# Patient Record
Sex: Female | Born: 1985 | Race: White | Hispanic: No | Marital: Single | Smoking: Former smoker
Health system: Southern US, Community
[De-identification: ages and names within clinical notes are randomized; demographics above are authoritative.]

## PROBLEM LIST (undated history)

## (undated) DIAGNOSIS — F32A Depression, unspecified: Secondary | ICD-10-CM

## (undated) DIAGNOSIS — O139 Gestational [pregnancy-induced] hypertension without significant proteinuria, unspecified trimester: Secondary | ICD-10-CM

## (undated) DIAGNOSIS — F329 Major depressive disorder, single episode, unspecified: Secondary | ICD-10-CM

## (undated) HISTORY — PX: TUBAL LIGATION: SHX77

## (undated) HISTORY — PX: BREAST SURGERY: SHX581

---

## 2006-11-16 ENCOUNTER — Emergency Department: Payer: Self-pay | Admitting: Unknown Physician Specialty

## 2007-07-04 ENCOUNTER — Observation Stay: Payer: Self-pay | Admitting: Unknown Physician Specialty

## 2008-06-29 ENCOUNTER — Inpatient Hospital Stay: Payer: Self-pay | Admitting: Internal Medicine

## 2009-01-07 ENCOUNTER — Emergency Department: Payer: Self-pay | Admitting: Internal Medicine

## 2010-08-03 ENCOUNTER — Emergency Department: Payer: Self-pay | Admitting: Emergency Medicine

## 2011-04-12 ENCOUNTER — Emergency Department: Payer: Self-pay | Admitting: Emergency Medicine

## 2011-11-05 ENCOUNTER — Emergency Department: Payer: Self-pay | Admitting: *Deleted

## 2011-11-05 LAB — COMPREHENSIVE METABOLIC PANEL
Albumin: 3.9 g/dL (ref 3.4–5.0)
Alkaline Phosphatase: 83 U/L (ref 50–136)
Anion Gap: 6 — ABNORMAL LOW (ref 7–16)
Bilirubin,Total: 0.4 mg/dL (ref 0.2–1.0)
Chloride: 108 mmol/L — ABNORMAL HIGH (ref 98–107)
Co2: 27 mmol/L (ref 21–32)
Creatinine: 0.75 mg/dL (ref 0.60–1.30)
EGFR (Non-African Amer.): >60
Glucose: 83 mg/dL (ref 65–99)
SGOT(AST): 55 U/L — ABNORMAL HIGH (ref 15–37)
SGPT (ALT): 94 U/L — ABNORMAL HIGH
Sodium: 141 mmol/L (ref 136–145)
Total Protein: 7.2 g/dL (ref 6.4–8.2)

## 2011-11-05 LAB — URINALYSIS, COMPLETE
Bacteria: NONE SEEN
Bilirubin,UR: NEGATIVE
Leukocyte Esterase: NEGATIVE
Nitrite: NEGATIVE
RBC,UR: 5 /HPF (ref 0–5)
Squamous Epithelial: 1

## 2011-11-05 LAB — HCG, QUANTITATIVE, PREGNANCY: Beta Hcg, Quant.: <1 m[IU]/mL — ABNORMAL LOW

## 2011-11-05 LAB — CBC
MCHC: 32.5 g/dL (ref 32.0–36.0)
WBC: 12.5 10*3/uL — ABNORMAL HIGH (ref 3.6–11.0)

## 2013-04-24 ENCOUNTER — Observation Stay: Payer: Self-pay

## 2013-04-24 LAB — DRUG SCREEN, URINE
Amphetamines, Ur Screen: NEGATIVE (ref ?–1000)
Barbiturates, Ur Screen: POSITIVE (ref ?–200)
Benzodiazepine, Ur Scrn: NEGATIVE (ref ?–200)
Cannabinoid 50 Ng, Ur ~~LOC~~: NEGATIVE (ref ?–50)
MDMA (Ecstasy)Ur Screen: POSITIVE (ref ?–500)
Tricyclic, Ur Screen: NEGATIVE (ref ?–1000)

## 2013-04-24 LAB — CBC WITH DIFFERENTIAL/PLATELET
Basophil #: 0 10*3/uL (ref 0.0–0.1)
Basophil %: 0.3 %
Eosinophil %: 0.1 %
HGB: 11.1 g/dL — ABNORMAL LOW (ref 12.0–16.0)
Lymphocyte #: 0.7 10*3/uL — ABNORMAL LOW (ref 1.0–3.6)
Lymphocyte %: 5.9 %
MCH: 25.2 pg — ABNORMAL LOW (ref 26.0–34.0)
MCV: 75 fL — ABNORMAL LOW (ref 80–100)
Monocyte #: 0.5 x10 3/mm (ref 0.2–0.9)
Monocyte %: 4 %
Neutrophil %: 89.7 %
Platelet: 221 10*3/uL (ref 150–440)
RBC: 4.42 10*6/uL (ref 3.80–5.20)
RDW: 14.1 % (ref 11.5–14.5)
WBC: 11.7 10*3/uL — ABNORMAL HIGH (ref 3.6–11.0)

## 2013-04-24 LAB — URINALYSIS, COMPLETE
Bacteria: NONE SEEN
Blood: NEGATIVE
Glucose,UR: NEGATIVE mg/dL (ref 0–75)
Leukocyte Esterase: NEGATIVE
Ph: 6 (ref 4.5–8.0)
RBC,UR: NONE SEEN /HPF (ref 0–5)
Specific Gravity: 1.027 (ref 1.003–1.030)
Squamous Epithelial: 2

## 2013-04-24 LAB — BASIC METABOLIC PANEL
Anion Gap: 12 (ref 7–16)
BUN: 5 mg/dL — ABNORMAL LOW (ref 7–18)
Calcium, Total: 8.2 mg/dL — ABNORMAL LOW (ref 8.5–10.1)
EGFR (African American): >60
Osmolality: 274 (ref 275–301)
Sodium: 139 mmol/L (ref 136–145)

## 2013-05-04 ENCOUNTER — Observation Stay: Payer: Self-pay | Admitting: Obstetrics and Gynecology

## 2013-05-04 LAB — BASIC METABOLIC PANEL
Anion Gap: 8 (ref 7–16)
BUN: 4 mg/dL — ABNORMAL LOW (ref 7–18)
Chloride: 107 mmol/L (ref 98–107)
Co2: 22 mmol/L (ref 21–32)
Creatinine: 0.59 mg/dL — ABNORMAL LOW (ref 0.60–1.30)
EGFR (Non-African Amer.): >60
Glucose: 91 mg/dL (ref 65–99)
Osmolality: 270 (ref 275–301)
Potassium: 3.4 mmol/L — ABNORMAL LOW (ref 3.5–5.1)
Sodium: 137 mmol/L (ref 136–145)

## 2013-05-04 LAB — CBC WITH DIFFERENTIAL/PLATELET
Basophil #: 0 10*3/uL (ref 0.0–0.1)
Basophil %: 0.4 %
Eosinophil #: 0.1 10*3/uL (ref 0.0–0.7)
Eosinophil %: 0.9 %
HCT: 33.7 % — ABNORMAL LOW (ref 35.0–47.0)
HGB: 11.4 g/dL — ABNORMAL LOW (ref 12.0–16.0)
Lymphocyte #: 1.8 10*3/uL (ref 1.0–3.6)
Lymphocyte %: 17 %
MCHC: 33.9 g/dL (ref 32.0–36.0)
MCV: 75 fL — ABNORMAL LOW (ref 80–100)
Monocyte #: 0.9 x10 3/mm (ref 0.2–0.9)
Monocyte %: 8 %
Neutrophil #: 7.9 10*3/uL — ABNORMAL HIGH (ref 1.4–6.5)
Platelet: 248 10*3/uL (ref 150–440)
RBC: 4.48 10*6/uL (ref 3.80–5.20)
RDW: 14.1 % (ref 11.5–14.5)

## 2013-05-04 LAB — TROPONIN I: Troponin-I: <0.02 ng/mL

## 2013-11-14 ENCOUNTER — Ambulatory Visit: Payer: Self-pay | Admitting: Internal Medicine

## 2013-11-26 LAB — CBC
HCT: 37.9 % (ref 35.0–47.0)
HGB: 12.4 g/dL (ref 12.0–16.0)
MCH: 24.7 pg — AB (ref 26.0–34.0)
MCHC: 32.6 g/dL (ref 32.0–36.0)
MCV: 76 fL — AB (ref 80–100)
Platelet: 297 10*3/uL (ref 150–440)
RBC: 4.99 10*6/uL (ref 3.80–5.20)
RDW: 15.8 % — ABNORMAL HIGH (ref 11.5–14.5)
WBC: 13.8 10*3/uL — AB (ref 3.6–11.0)

## 2013-11-26 LAB — COMPREHENSIVE METABOLIC PANEL
ALBUMIN: 4.3 g/dL (ref 3.4–5.0)
Alkaline Phosphatase: 139 U/L — ABNORMAL HIGH
Anion Gap: 8 (ref 7–16)
BUN: 8 mg/dL (ref 7–18)
Bilirubin,Total: 0.8 mg/dL (ref 0.2–1.0)
CALCIUM: 8.8 mg/dL (ref 8.5–10.1)
CHLORIDE: 109 mmol/L — AB (ref 98–107)
CO2: 25 mmol/L (ref 21–32)
Creatinine: 0.99 mg/dL (ref 0.60–1.30)
EGFR (African American): >60
GLUCOSE: 109 mg/dL — AB (ref 65–99)
Osmolality: 282 (ref 275–301)
Potassium: 3.3 mmol/L — ABNORMAL LOW (ref 3.5–5.1)
SGOT(AST): 25 U/L (ref 15–37)
SGPT (ALT): 31 U/L (ref 12–78)
Sodium: 142 mmol/L (ref 136–145)
TOTAL PROTEIN: 8.6 g/dL — AB (ref 6.4–8.2)

## 2013-11-26 LAB — SALICYLATE LEVEL: SALICYLATES, SERUM: 4 mg/dL — AB

## 2013-11-26 LAB — ETHANOL

## 2013-11-26 LAB — ACETAMINOPHEN LEVEL: Acetaminophen: <2 ug/mL

## 2013-11-26 LAB — TSH: THYROID STIMULATING HORM: 3.59 u[IU]/mL

## 2013-11-27 ENCOUNTER — Inpatient Hospital Stay: Payer: Self-pay | Admitting: Psychiatry

## 2013-11-27 LAB — DRUG SCREEN, URINE
Amphetamines, Ur Screen: NEGATIVE (ref ?–1000)
BARBITURATES, UR SCREEN: NEGATIVE (ref ?–200)
Benzodiazepine, Ur Scrn: NEGATIVE (ref ?–200)
Cannabinoid 50 Ng, Ur ~~LOC~~: POSITIVE (ref ?–50)
Cocaine Metabolite,Ur ~~LOC~~: NEGATIVE (ref ?–300)
MDMA (Ecstasy)Ur Screen: POSITIVE (ref ?–500)
Methadone, Ur Screen: NEGATIVE (ref ?–300)
OPIATE, UR SCREEN: NEGATIVE (ref ?–300)
PHENCYCLIDINE (PCP) UR S: NEGATIVE (ref ?–25)
Tricyclic, Ur Screen: NEGATIVE (ref ?–1000)

## 2013-11-27 LAB — URINALYSIS, COMPLETE
BLOOD: NEGATIVE
Bilirubin,UR: NEGATIVE
Glucose,UR: NEGATIVE mg/dL (ref 0–75)
Ketone: NEGATIVE
Leukocyte Esterase: NEGATIVE
NITRITE: NEGATIVE
Ph: 6 (ref 4.5–8.0)
SPECIFIC GRAVITY: 1.025 (ref 1.003–1.030)
Squamous Epithelial: 32

## 2013-11-27 LAB — PREGNANCY, URINE: Pregnancy Test, Urine: NEGATIVE m[IU]/mL

## 2013-12-01 LAB — LIPID PANEL
Cholesterol: 132 mg/dL (ref 0–200)
HDL Cholesterol: 38 mg/dL — ABNORMAL LOW (ref 40–60)
Ldl Cholesterol, Calc: 86 mg/dL (ref 0–100)
TRIGLYCERIDES: 42 mg/dL (ref 0–200)
VLDL CHOLESTEROL, CALC: 8 mg/dL (ref 5–40)

## 2013-12-01 LAB — HEMOGLOBIN A1C: HEMOGLOBIN A1C: 5.3 % (ref 4.2–6.3)

## 2013-12-06 LAB — LITHIUM LEVEL: Lithium: 0.49 mmol/L — ABNORMAL LOW

## 2013-12-10 ENCOUNTER — Inpatient Hospital Stay: Payer: Self-pay | Admitting: Internal Medicine

## 2013-12-10 DIAGNOSIS — I369 Nonrheumatic tricuspid valve disorder, unspecified: Secondary | ICD-10-CM

## 2013-12-10 LAB — PRO B NATRIURETIC PEPTIDE: B-TYPE NATIURETIC PEPTID: 130 pg/mL — AB (ref 0–125)

## 2013-12-10 LAB — DRUG SCREEN, URINE
AMPHETAMINES, UR SCREEN: NEGATIVE (ref ?–1000)
BENZODIAZEPINE, UR SCRN: NEGATIVE (ref ?–200)
Barbiturates, Ur Screen: NEGATIVE (ref ?–200)
Cannabinoid 50 Ng, Ur ~~LOC~~: POSITIVE (ref ?–50)
Cocaine Metabolite,Ur ~~LOC~~: NEGATIVE (ref ?–300)
MDMA (Ecstasy)Ur Screen: NEGATIVE (ref ?–500)
Methadone, Ur Screen: NEGATIVE (ref ?–300)
OPIATE, UR SCREEN: NEGATIVE (ref ?–300)
Phencyclidine (PCP) Ur S: NEGATIVE (ref ?–25)
Tricyclic, Ur Screen: POSITIVE (ref ?–1000)

## 2013-12-10 LAB — PROTIME-INR
INR: 1.1
Prothrombin Time: 13.9 secs (ref 11.5–14.7)

## 2013-12-10 LAB — COMPREHENSIVE METABOLIC PANEL
ALBUMIN: 3.1 g/dL — AB (ref 3.4–5.0)
ALT: 29 U/L
Alkaline Phosphatase: 101 U/L
Anion Gap: 9 (ref 7–16)
BUN: 7 mg/dL (ref 7–18)
CALCIUM: 7.9 mg/dL — AB (ref 8.5–10.1)
Chloride: 105 mmol/L (ref 98–107)
Co2: 27 mmol/L (ref 21–32)
Creatinine: 0.73 mg/dL (ref 0.60–1.30)
EGFR (African American): >60
EGFR (Non-African Amer.): >60
Glucose: 94 mg/dL (ref 65–99)
OSMOLALITY: 279 (ref 275–301)
POTASSIUM: 4 mmol/L (ref 3.5–5.1)
SGOT(AST): 25 U/L (ref 15–37)
Sodium: 141 mmol/L (ref 136–145)
Total Protein: 6.4 g/dL (ref 6.4–8.2)

## 2013-12-10 LAB — D-DIMER(ARMC): D-DIMER: 803 ng/mL

## 2013-12-10 LAB — ACETAMINOPHEN LEVEL: Acetaminophen: <2 ug/mL

## 2013-12-10 LAB — CBC
HCT: 36.5 % (ref 35.0–47.0)
HGB: 11.8 g/dL — AB (ref 12.0–16.0)
MCH: 24.8 pg — AB (ref 26.0–34.0)
MCHC: 32.3 g/dL (ref 32.0–36.0)
MCV: 77 fL — ABNORMAL LOW (ref 80–100)
Platelet: 282 10*3/uL (ref 150–440)
RBC: 4.76 10*6/uL (ref 3.80–5.20)
RDW: 15.6 % — ABNORMAL HIGH (ref 11.5–14.5)
WBC: 9.1 10*3/uL (ref 3.6–11.0)

## 2013-12-10 LAB — SALICYLATE LEVEL: SALICYLATES, SERUM: 2.3 mg/dL

## 2013-12-10 LAB — CARBAMAZEPINE LEVEL, TOTAL: CARBAMAZEPINE: 16.7 ug/mL — AB (ref 4.0–12.0)

## 2013-12-10 LAB — ETHANOL: Ethanol %: <0.003 % (ref 0.000–0.080)

## 2013-12-10 LAB — TSH: Thyroid Stimulating Horm: 6.48 u[IU]/mL — ABNORMAL HIGH

## 2013-12-10 LAB — LITHIUM LEVEL: LITHIUM: 0.38 mmol/L — AB

## 2013-12-10 LAB — TROPONIN I: Troponin-I: <0.02 ng/mL

## 2013-12-10 LAB — T4, FREE: Free Thyroxine: 0.66 ng/dL — ABNORMAL LOW (ref 0.76–1.46)

## 2013-12-11 LAB — CBC WITH DIFFERENTIAL/PLATELET
Basophil #: 0.1 10*3/uL (ref 0.0–0.1)
Basophil %: 0.9 %
Eosinophil #: 0.4 10*3/uL (ref 0.0–0.7)
Eosinophil %: 6 %
HCT: 37.7 % (ref 35.0–47.0)
HGB: 12.3 g/dL (ref 12.0–16.0)
LYMPHS ABS: 2.6 10*3/uL (ref 1.0–3.6)
LYMPHS PCT: 35.3 %
MCH: 24.9 pg — ABNORMAL LOW (ref 26.0–34.0)
MCHC: 32.6 g/dL (ref 32.0–36.0)
MCV: 76 fL — ABNORMAL LOW (ref 80–100)
MONO ABS: 0.5 x10 3/mm (ref 0.2–0.9)
Monocyte %: 7.4 %
Neutrophil #: 3.7 10*3/uL (ref 1.4–6.5)
Neutrophil %: 50.4 %
Platelet: 299 10*3/uL (ref 150–440)
RBC: 4.94 10*6/uL (ref 3.80–5.20)
RDW: 15.6 % — AB (ref 11.5–14.5)
WBC: 7.3 10*3/uL (ref 3.6–11.0)

## 2013-12-11 LAB — BASIC METABOLIC PANEL
Anion Gap: 7 (ref 7–16)
BUN: 10 mg/dL (ref 7–18)
CREATININE: 0.91 mg/dL (ref 0.60–1.30)
Calcium, Total: 7.7 mg/dL — ABNORMAL LOW (ref 8.5–10.1)
Chloride: 105 mmol/L (ref 98–107)
Co2: 28 mmol/L (ref 21–32)
EGFR (African American): >60
EGFR (Non-African Amer.): >60
GLUCOSE: 91 mg/dL (ref 65–99)
OSMOLALITY: 278 (ref 275–301)
POTASSIUM: 4 mmol/L (ref 3.5–5.1)
Sodium: 140 mmol/L (ref 136–145)

## 2013-12-11 LAB — PROTIME-INR
INR: 1.2
Prothrombin Time: 14.6 secs (ref 11.5–14.7)

## 2013-12-12 LAB — PROTIME-INR
INR: 2.5
Prothrombin Time: 26.6 secs — ABNORMAL HIGH (ref 11.5–14.7)

## 2013-12-14 LAB — PROT IMMUNOELECTROPHORES(ARMC)

## 2013-12-15 ENCOUNTER — Ambulatory Visit: Payer: Self-pay | Admitting: Internal Medicine

## 2014-01-16 ENCOUNTER — Ambulatory Visit: Payer: Self-pay | Admitting: Internal Medicine

## 2014-02-14 ENCOUNTER — Ambulatory Visit: Payer: Self-pay | Admitting: Internal Medicine

## 2014-04-03 ENCOUNTER — Emergency Department: Payer: Self-pay | Admitting: Emergency Medicine

## 2014-04-03 LAB — URINALYSIS, COMPLETE
BILIRUBIN, UR: NEGATIVE
Bacteria: NONE SEEN
Glucose,UR: NEGATIVE mg/dL (ref 0–75)
Ketone: NEGATIVE
NITRITE: NEGATIVE
PH: 7 (ref 4.5–8.0)
Protein: 100
SPECIFIC GRAVITY: 1.021 (ref 1.003–1.030)
WBC UR: 68 /HPF (ref 0–5)

## 2014-04-03 LAB — PREGNANCY, URINE: Pregnancy Test, Urine: NEGATIVE m[IU]/mL

## 2014-08-02 ENCOUNTER — Emergency Department: Payer: Self-pay | Admitting: Emergency Medicine

## 2014-08-21 ENCOUNTER — Emergency Department
Admit: 2014-08-21 | Disposition: A | Discharge status: Home / Self Care | Payer: Self-pay | Admitting: Emergency Medicine

## 2014-08-21 LAB — CBC WITH DIFFERENTIAL/PLATELET
BASOS ABS: 0 10*3/uL (ref 0.0–0.1)
Basophil %: 0.2 %
Eosinophil #: 0 10*3/uL (ref 0.0–0.7)
Eosinophil %: 0 %
HCT: 37.5 % (ref 35.0–47.0)
HGB: 12.3 g/dL (ref 12.0–16.0)
Lymphocyte #: 0.9 10*3/uL — ABNORMAL LOW (ref 1.0–3.6)
Lymphocyte %: 5.7 %
MCH: 24.9 pg — ABNORMAL LOW (ref 26.0–34.0)
MCHC: 32.7 g/dL (ref 32.0–36.0)
MCV: 76 fL — AB (ref 80–100)
MONOS PCT: 8.1 %
Monocyte #: 1.3 x10 3/mm — ABNORMAL HIGH (ref 0.2–0.9)
Neutrophil #: 13.5 10*3/uL — ABNORMAL HIGH (ref 1.4–6.5)
Neutrophil %: 86 %
Platelet: 224 10*3/uL (ref 150–440)
RBC: 4.92 10*6/uL (ref 3.80–5.20)
RDW: 14.4 % (ref 11.5–14.5)
WBC: 15.8 10*3/uL — AB (ref 3.6–11.0)

## 2014-08-21 LAB — URINALYSIS, COMPLETE
Bacteria: NONE SEEN
Bilirubin,UR: NEGATIVE
Glucose,UR: NEGATIVE mg/dL (ref 0–75)
LEUKOCYTE ESTERASE: NEGATIVE
NITRITE: NEGATIVE
Ph: 6 (ref 4.5–8.0)
Protein: 30
RBC,UR: 42 /HPF (ref 0–5)
Specific Gravity: 1.024 (ref 1.003–1.030)
Squamous Epithelial: 4

## 2014-08-21 LAB — COMPREHENSIVE METABOLIC PANEL
ALBUMIN: 4.3 g/dL
ALT: 10 U/L — AB
AST: 17 U/L
Alkaline Phosphatase: 70 U/L
Anion Gap: 10 (ref 7–16)
BUN: 6 mg/dL
Bilirubin,Total: 0.4 mg/dL
CALCIUM: 8.7 mg/dL — AB
CHLORIDE: 104 mmol/L
Co2: 24 mmol/L
Creatinine: 0.79 mg/dL
EGFR (Non-African Amer.): >60
GLUCOSE: 142 mg/dL — AB
Potassium: 2.8 mmol/L — ABNORMAL LOW
Sodium: 138 mmol/L
Total Protein: 7.7 g/dL

## 2014-08-24 LAB — URINE CULTURE

## 2014-09-07 NOTE — Consult Note (Signed)
Psychological Assessment  Justice RocherWhitney Neville26of Evaluation: 7-20-15Administered: [place] Multiphasic Personality Inventory-2 (MMPI-2) for Referral: [name] was referred for a psychological assessment by her physician, Kristine LineaJolanta Pucilowska, MD. She was admitted to Behavioral Medicine for the treatment of acute behavioral changes with threats to harm herself and others.  Please see the history and physical and psychosocial history for further background information. An assessment of personality structure was requested. [name] completed the MMPI-2 on day 6 of her hospitalization. The MMPI-2 validity scales indicate that the clinical profile is not valid. Her performance suggests and acute disturbance and her current profile is not likely to be stable over time.  Impression:of Bipolar Disorder and Personality Disorder                            Electronic Signatures: Carola FrostRoush, Lee Ann (PsyD, HSP-P)  (Signed on 20-Jul-15 15:55)  Authored  Last Updated: 20-Jul-15 15:55 by Carola Frostoush, Lee Ann (PsyD, HSP-P)

## 2014-09-07 NOTE — Consult Note (Signed)
PATIENT NAMNolene Burch:  Violet, Angelo MR#:  [id number] DATE OF BIRTH:  [birth year]  DATE OF CONSULTATION:  11/27/2013  REFERRING PHYSICIAN:  Maurilio LovelyNoelle McLaurin, MD CONSULTING PHYSICIAN:  Ardeen FillersUzma S. Garnetta BuddyFaheem, MD  REASON FOR CONSULTATION: "I am not hearing my grandmother appropriate.  Who do you want to talk to?"   HISTORY OF PRESENT ILLNESS: The patient is a 29 year old female with history of bipolar disorder, who presented to the ER paranoid and screaming. She was unable to provide any comprehensive history. During my interview, the patient reported that it is a very long story and she does not remember where to start.  Reported that it started 2 days ago. She reported that she  lives with her two children, ages 648 and 28 years old and pretended to be an excellent mother; however, for the past 7-8 days, she has been without sleeping. She has been trying to relax herself with alcohol and was drinking bottles of alcohol on a daily basis. She consumed 6 shots yesterday. She reported that she has auditory hallucinations and hears different type of voices, and one of them is from her friend, which makes her very upset, and it is a hateful voice and she wants to kill her close friend. She reported that the most common voice is of her grandmother who tells her to take care of her children. The patient was crying and rocking on her bed. She reported that she does not take any medications. Reported that she called her friend Lucifer and it is bothering her all the time. The patient reported that this has happened to her before in the past, but she is not taking any medications at this time. Reported that she is not concerned about the people, but the voices has been bothering her most of the time. She reported that she cannot explain how she has been feeling at this time. Remains tangential, paranoid, and was hearing other people.   PAST PSYCHIATRIC HISTORY: The patient reported that she does not take any medications on a regular  basis, but was prescribed sertraline 100 mg at the clinic and they advised her to continue taking the medications until she finds a psychiatrist; however, she told them that she does not have any money to find somebody else. She reported that she feels that the sertraline made her worse. She does not sleep well and has been having issues with insomnia.   CURRENT HOME MEDICATIONS: Sertraline 100 mg 1.5 pills daily, trazodone 100 mg at bedtime, fludarabine 25 mg at bedtime, multivitamins prenatal 1 tablet every daily, Tylenol 500 mg every 6 hours as needed for pain.   ALLERGIES: NO KNOWN DRUG ALLERGIES.   FAMILY HISTORY: The patient is unable to explain any family history of mental illness.   PAST MEDICAL HISTORY: Depression, breast surgery and C-section   VITAL SIGNS: Temperature 98.2, pulse 94, respirations 18, blood pressure 139/96.  LABORATORY DATA: Glucose 109, BUN 8, creatinine 0.99, sodium 142, potassium 3.3, chloride 109, bicarbonate 25, anion gap 8, osmolality is 282, blood alcohol is 3, protein 8.6, albumin 4.3, bilirubin 0.8, AST 25, ALT 31, TSH 3.59. UDS is positive for cannabinoids, as well as MDMA. WBC 13.8, RBC 4.99 hemoglobin 12.4, platelet count 297,000, RDW is 15.8.   REVIEW OF SYSTEMS:  CONSTITUTIONAL: Denies any fever or chills.  EYES: No double or blurred vision.  RESPIRATORY: No shortness of breath or cough.  CARDIOVASCULAR: No chest pain. GASTROINTESTINAL:  No abdominal pain or hematuria. GENITOURINARY: No incontinence or frequency.  MENTAL STATUS EXAMINATION: The patient is a disheveled -appearing female who was sitting in the bed. She was rocking back and forth. Her speech was pressured. Mood was anxious and agitated. Affect was congruent. Thought process tangential. She has auditory hallucinations and has different voices interacting with each other, and she was unable to explain in detail. Her language was inappropriate. Fund of knowledge seems inappropriate as well.  She was unable to contract for safety at this time.   DIAGNOSTIC IMPRESSION:  AXIS I:  1.  Bipolar I disorder, most recent episode manic, severe with psychotic features.  2.  Alcohol dependence.  AXIS II: None.  AXIS III: None reported.   TREATMENT PLAN:  1.  The patient will be admitted to the inpatient behavioral health unit for stabilization and safety.  2.  I will start her on Seroquel 25 mg p.o. t.i.d.  3.  I will also start her on lithium 450 mg p.o. at bedtime.  4.  She will be monitored closely by the staff and her medication will be adjusted by the treatment team.   Thank you for allowing me to participate in the care of this patient.    ____________________________ Ardeen Fillers. Garnetta Buddy, MD usf:ts D: 11/27/2013 14:13:54 ET T: 11/27/2013 15:23:41 ET JOB#: [id number]  cc: Ardeen Fillers. Garnetta Buddy, MD, <Dictator> Rhunette Croft MD ELECTRONICALLY SIGNED 12/04/2013 13:07

## 2014-09-07 NOTE — Consult Note (Signed)
Brief Consult Note: Diagnosis: Bipolar disorder most recent episode mixed with psychosis.   Patient was seen by consultant.   Consult note dictated.   Recommend further assessment or treatment.   Orders entered.   Comments: [name] has a h/o mood instability. She was just discharged from BMU. She has been compliant with medications and returns for svere anxiety due to PE. She is not suicidal or homicidal.  PLAN:  1. The patient no longer meets criteria for IVC. I will terminate proceedings.   2. Will hold Seroquel, possible culprit. We will Increase Lithium to 600 mg bid, hold Tegretol du to high level and start Clonazepam 0.5 mg qid as needed for panic attacks.  3. Psychiatry will follow.  Electronic Signatures: Kristine LineaPucilowska, Jolanta (MD)  (Signed 27-Jul-15 18:34)  Authored: Brief Consult Note   Last Updated: 27-Jul-15 18:34 by Kristine LineaPucilowska, Jolanta (MD)

## 2014-09-07 NOTE — Consult Note (Signed)
Brief Consult Note: Diagnosis: Bipolar disorder most recent episode mixed with psychosis.   Patient was seen by consultant.   Consult note dictated.   Recommend further assessment or treatment.   Orders entered.   Comments: [name] has a h/o mood instability. She was just discharged from BMU. She has been compliant with medications and returns for svere anxiety due to PE. She is not suicidal or homicidal.  PLAN:  1. The patient no longer meets criteria for IVC. I will terminate proceedings.   2. Will hold Seroquel, possible culprit. We will Increase Lithium to 600 mg bid, hold Tegretol du to high level and start Clonazepam 0.5 mg qid as needed for panic attacks.  3. Psychiatry will follow.  Electronic Signatures: Kristine LineaPucilowska, Vidya Bamford (MD)  (Signed 01-Aug-15 07:17)  Authored: Brief Consult Note   Last Updated: 01-Aug-15 07:17 by Kristine LineaPucilowska, Rebeka Kimble (MD)

## 2014-09-07 NOTE — Consult Note (Signed)
PATIENT NAMECHRISTA, Burch MR#:  [id number] DATE OF BIRTH:  05/31/1985  DATE OF CONSULTATION:  12/11/2013  REFERRING PHYSICIAN:  Dr. Eliane Decree.  CONSULTING PHYSICIAN:  Charlye Spare R. Sherrlyn Hock, MD  REASON FOR CONSULTATION: A 29 year old female admitted with pulmonary embolism of unclear etiology.   HISTORY OF PRESENT ILLNESS: The patient is a 29 year old female with a past medical history significant for bipolar disorder, depression with some personality disorder, C-section about 6 months ago at [facility] and according to patient had significant bleeding issues immediately postoperatively requiring surgical drainage of bleeding/clots. The patient also states that during the last month of her recent pregnancy, she had come to the ER in Dec 2014 with some symptoms of chest pain and dyspnea and had an elevated D-dimer but was not further investigated for thromboembolism and symptoms did slowly improve. She does not have any other known history of thromboembolic phenomena in the past. She is currently admitted on July 27th with shortness of breath and chest pain, elevated D-dimer and CT scan of the chest showed subsegmental branches of right lower lobe pulmonary embolism along with acute segmental thromboembolism in branch of the lingula in the left lung. The patient has been started on anticoagulation with Lovenox and plan for Coumadin. Some of the hypercoagulable state workup has already been sent. The patient states that her maternal grandfather and her paternal grandmother both had history of blood clots in their 43s. Denies any other known family history of thromboembolism, early strokes or heart attacks. States that she remains physically active since she has to care for 3 children, including her 8-year-old son who has been taking treatment for ALL for the last 2 years. She denies taking any recent long road trips or plane rides, periods of inactivity or sedentary lifestyle, or taking birth control  pills or any estrogen-containing medications.   PAST MEDICAL HISTORY AND PAST SURGICAL HISTORY: As in HPI above.   FAMILY HISTORY: As in HPI above.   SOCIAL HISTORY: Denies smoking cigarettes. History of smoking marijuana frequently. Denies alcohol.   HOME MEDICATIONS:  1. Seroquel 200 mg extended release once daily.  2. Trazodone 100 mg 2 tablets once daily.  3. Prazosin 5 mg b.i.d.  4. Lithium 300 mg 2 tablets twice daily and 3 tablets at bedtime.  5. Hydroxyzine 50 mg p.o. q.i.d.  6. Clonazepam 1.5 mg q.i.d.  7. Carbamazepine 200 mg b.i.d. and 200 mg at bedtime.   ALLERGIES: No known drug allergies.   REVIEW OF SYSTEMS:  CONSTITUTIONAL: Still having some tiredness and generalized weakness with fatigability. No fevers, chills or night sweats.  HEENT: Denies any headaches or dizziness at rest. No epistaxis, ear or jaw pain. No sinus symptoms.  CARDIAC: Denies angina, palpitation, orthopnea or PND.  LUNGS: As in HPI. No hemoptysis.  GASTROINTESTINAL: No nausea, vomiting or diarrhea. No bright red blood in stools or melena.  GENITOURINARY: No dysuria or hematuria.  SKIN: No new rashes or pruritus.  HEMATOLOGIC: No recent obvious bleeding issues.  EXTREMITIES: Has some discomfort in her legs on walking with minimal swelling.  MUSCULOSKELETAL: No new bone pains.  NEUROLOGIC: No new focal weakness, seizures or loss of consciousness.  ENDOCRINE: No polyuria or polydipsia. Appetite is good.   PHYSICAL EXAMINATION:  GENERAL: The patient is a moderately-built and well-nourished individual, sitting in chair, alert and oriented, in no acute distress, converses appropriately. No icterus or pallor.  VITAL SIGNS: 98.5, 91, 18, 122/82, 98% on room air.  HEENT: Normocephalic, atraumatic. Extraocular movements  intact. Sclerae anicteric. No oral thrush.  NECK: Negative for lymphadenopathy.  CARDIOVASCULAR: S1, S2, regular rate and rhythm. No murmur.  LUNGS: Bilateral good air entry. No  crepitations or rhonchi.  ABDOMEN: Soft, nontender. No hepatosplenomegaly clinically.  EXTREMITIES: Trace edema. No cyanosis.  SKIN: No generalized rashes or major bruising.  LYMPHATICS: No adenopathy in axillary or inguinal areas.  NEUROLOGIC: Grossly nonfocal. Cranial nerves intact.   LABORATORY RESULTS: INR 1.2. Chest x-ray reports no abnormality noted. Creatinine 0.73, calcium 7.9. LFTs unremarkable except albumin of 3.1. Hemoglobin 11.8, platelets 282, MCV 77, WBC 9100. TSH elevated at 6.48.   IMPRESSION AND RECOMMENDATIONS: The patient is a 29 year old female with history of bipolar disorder and depression, cesarean section about 6 months ago to deliver her third child, reportedly complicated by postoperative bleeding and large clots internally requiring surgical intervention who has been admitted with progressive respiratory symptoms and found to have evidence of bilateral pulmonary embolism. Etiology for thromboembolism is unclear. Could be unprovoked, although she did have recent pregnancy and has had symptoms similar to current since the last month of her pregnancy. The patient does have some family history of thromboembolism in both grandparents but denies similar history in her parents or siblings. Agree with plan for anticoagulation and to get hypercoagulable workup to evaluate for any thrombophilic states. Antithrombin, antiphospholipid antibody, factor V Leiden, protein C and S panel have already been sent. Will also request beta-2 glycoprotein 1 panel, factor II mutation analysis, SIEP and bilateral lower extremity venous Doppler to look for evidence of deep vein thrombosis. Lupus anticoagulant will need to be checked later when she is not on heparin. If the patient is discharged soon, will follow up as outpatient to discuss results of hypercoagulable workup. The patient has been advised to avoid recreational drug usage, remain physically active, avoid the use of birth control pills and  estrogen-containing medications. The patient was explained above, agreeable to this plan.   Thank you for the referral. Please feel free to contact me if any additional questions.   ____________________________ Maren ReamerSandeep R. Sherrlyn HockPandit, MD srp:gb D: 12/12/2013 00:39:26 ET T: 12/12/2013 01:02:48 ET JOB#: [id number]  cc: Darryll CapersSandeep R. Sherrlyn HockPandit, MD, <Dictator> Wille CelesteSANDEEP R Evalyne Cortopassi MD ELECTRONICALLY SIGNED 12/12/2013 10:05
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
## 2014-09-07 NOTE — H&P (Signed)
PATIENT NAMEAUNESTI, [name] MR#:  [id number] DATE OF BIRTH:  Jan 16, 1986  DATE OF ADMISSION:  12/10/2013  PRIMARY CARE PHYSICIAN: At [facility].   REFERRING ER PHYSICIAN: Dorothea Glassman, M.D.   CHIEF COMPLAINT: Chest pain and shortness of breath.   HISTORY OF PRESENTING ILLNESS: A 29 year old female with history of bipolar disorder and depression with some personality disorder, was recently admitted to psychiatry floor 4 days ago and discharged. For the last 3 to 4 days, she started having panic attacks with shortness of breath and some chest pain on the left side and so decided to come to the Emergency Room. Initial workup in the ER was negative and ER did consult with psychiatry which changed some medications for her psychiatric issues and cleared her for discharge but then later on the d-dimer level came high and so the ER did a CT scan of the chest with contrast which reported her having pulmonary embolism so she is given as admission to the medical team while later on psychiatry decided to put her on involuntary commitment for her suicidal ideation. On further questioning, the patient denies any long traveling or recent surgeries. She said that when she delivered her baby in the last December at [facility], they had to do some surgery and she had some clots in her stomach.  They had to remove those clots by surgery but they said she is high risk to develop clots in the future but did not give any anticoagulation at that time.   REVIEW OF SYSTEMS:    CONSTITUTIONAL: Negative for fever, fatigue, weakness, pain or weight loss.  EYES: No blurring, double vision, discharge or redness.  EARS, NOSE, THROAT: No tinnitus, ear pain or hearing loss.  RESPIRATORY: No cough, wheezing, hemoptysis. Mild shortness of breath is there. CARDIOVASCULAR: The patient has chest pain but no orthopnea, edema, arrhythmia or palpitations.  GASTROINTESTINAL: No nausea, vomiting, diarrhea, abdominal pain.   GENITOURINARY: No dysuria, hematuria or increased frequency.  ENDOCRINE: No heat or cold intolerance.  SKIN: No acne, rashes or lesions.  MUSCULOSKELETAL: No pain or swelling in the joints.  NEUROLOGICAL: No numbness, weakness, tremor or vertigo.  PSYCHIATRIC: Has some suicidal ideation, feeling depressed.   PAST MEDICAL HISTORY:  Positive for depression and bipolar.   PAST SURGICAL HISTORY: None.   SOCIAL HISTORY: Denies smoking cigarettes but she smoked marijuana almost once a day. Denies alcohol or illegal drug use.   FAMILY HISTORY: Positive for blood clots in her grandmothers on both sides and mother and father. They died in their 14s.   HOME MEDICATIONS:  1.  Trazodone 100 mg oral 2 tablets once a day.  2.  Seroquel 200 mg oral extended-release once a day.  3.  Prazosin 5 mg oral capsule 2 times a day.  4.  Lithium 300 mg oral tablet, 2 tablets 2 times a day.  5.  Lithium 300 mg oral tablet, 3 tablets at bedtime.  6.  Hydroxyzine hydrochloride 50 mg oral tablet 4 times a day.  7.  Clonazepam 1.5 mg oral 4 times a day.  8.  Carbamazepine 200 mg 2 times a day.  9.  Carbamazepine 200 mg oral at bedtime for mood stabilization   PHYSICAL EXAMINATION: VITAL SIGNS:  In ER, temperature 98, pulse 90, respirations 20, blood pressure is 130/74 and pulse oximetry is 99% on room air.  GENERAL: The patient is fully alert and oriented to time, place and person. Does not appear in any acute distress.  HEENT: Head and neck atraumatic. Conjunctivae pink. Oral mucosa moist.  NECK: Supple. No JVD.  RESPIRATORY: Bilateral equal and clear air entry.  CARDIOVASCULAR: S1, S2 present, regular. No murmur.  ABDOMEN: Soft, nontender. Bowel sounds present. No organomegaly. SKIN: No rashes.  LEGS: No edema.  NEUROLOGICAL: Power 5 out of 5. Follows commands. Moves all 4 limbs. No gross abnormality.  PSYCHIATRIC: Mood appears fine to me but the patient says she has suicidal ideation. Further evaluation  by psychiatry.   LABORATORY, DIAGNOSTIC AND RADIOLOGICAL DATA:   1.  Glucose 94. BNP is 130. BUN 7, creatinine 0.73, sodium is 141, potassium is 4, chloride is 105, CO2 27.   2.  Ethanol level less than 0.003. Lithium level 16.7.  3.  Total protein 6.4, albumin is 3.1, bilirubin less than 0.1, alkaline phosphate 101, SGOT 25, SGPT 29.  4.  Troponin less than 0.02.  5.  TSH is 6.48.  6.  Urinalysis is positive for cannabinoid and tricyclic antidepressant.   7.  WBC is 9.1, hemoglobin 11.8, platelet count is 282 and MCV is 77. D-dimer level is 803.  8.  Acetaminophen level less than 2.  9.  Salicylate is 2.3.  10.  CT angiogram of chest for pulmonary embolism is positive for acute pulmonary thromboembolism, occurs with subsegmental branches of posterior basal segment of right lower lobe.  11.  Chest x-ray, PA and lateral, no abnormality noted   ASSESSMENT AND PLAN: A 29 year old female with psychiatric history, came to hospital with having some chest pain and shortness of breath and panic episodes for the last 3 to 4 days multiple times and found having pulmonary embolism.  1.  Pulmonary embolism, acute.  Will treat with anticoagulation. Initially, I had thought to start Xarelto, later on spoke to pharmacy and they suggested because she is on Tegretol pharmacy already reviewed medication and continuing Tegretol that has some interaction with decreasing effectiveness of newer anticoagulant agents and so safer option is Coumadin once the INR is therapeutic.  We are already starting that and continue monitoring.  2.  Depression and bipolar management per psychiatry team on Tegretol and lithium.  3.  Hypothyroidism. We will check free T3 and free T4 level and continue monitoring.   TOTAL TIME SPENT ON THIS ADMISSION: 50 minutes.    ____________________________ Hope PigeonVaibhavkumar G. Elisabeth PigeonVachhani, MD vgv:cs D: 12/10/2013 16:48:23 ET T: 12/10/2013 18:41:56 ET JOB#: [id number]  cc: Hope PigeonVaibhavkumar G. Elisabeth PigeonVachhani,  MD, <Dictator> [facility] Altamese DillingVAIBHAVKUMAR Syd Newsome MD ELECTRONICALLY SIGNED 12/11/2013 16:16

## 2014-09-07 NOTE — Discharge Summary (Signed)
PATIENT NAMNolene Burch:  Brun, Tarea MR#:  [id number] DATE OF BIRTH:  18-Jul-1985  DATE OF ADMISSION:  12/10/2013 DATE OF DISCHARGE:  12/12/2013  PRESENTING COMPLAINT: Chest pain.   DISCHARGE DIAGNOSES: 1.  Acute pulmonary embolus. 2.  History of anxiety and depression.   CODE STATUS: FULL.  DISCHARGE MEDICATIONS: 1.  Prazosin 5 mg 1 b.i.d.  2.  Trazodone 100 mg 2 capsules at bedtime.  3.  Hydroxyzine hydrochloride 50 mg 4 times a day as needed.  4.  Carbamazepine 200 mg 1 tablet b.i.d.  5.  Lithium 300 mg 2 tablets b.i.d.  6.  Warfarin 3.5 mg p.o. daily.  7.  Clonazepam 1 mg 4 times a day.  8.  Levothyroxine 25 mcg p.o. daily.   NOTE: The patient advised to stop taking Seroquel.  DISCHARGE INSTRUCTIONS: [facility] on Friday, July 31st, and follow up with Dr. Sherrlyn HockPandit next Tuesday for hospital followup on PE.   CONSULTATIONS: Hematology/oncology with Dr. Sherrlyn HockPandit; psychiatry with Dr. Jennet MaduroPucilowska.  BRIEF SUMMARY OF HOSPITAL COURSE: [name] is a 29 year old Caucasian female with history of chronic anxiety and depression who came to the hospital having chest pain and shortness of breath with panic episode over the last 3 to 4 days, multiple times, and found to have pulmonary embolism on CT chest. She was admitted with:  1.  Acute pulmonary embolism. She remained hemodynamically stable, remained in sinus rhythm on telemetry. She was started on Lovenox plus Coumadin, changed to Coumadin when INR was therapeutic at 2.5. Hypercoagulable work-up was sent out. The patient's family has history of clotting disorder. Hematology consultation was obtained. The patient will follow up with hypercoagulable workup, labs and discussion with Dr. Sherrlyn HockPandit as an outpatient. Ultrasound Doppler of lower extremity was negative.  2.  Depression with bipolar disorder. Dr. Willia CrazePucilowska's evaluation was appreciated. The patient was on Tegretol and lithium. Her Seroquel was discontinued since at  times and rare circumstances Seroquel is known to give side effects with PE.  3.  Hypothyroidism, new onset. Started on Synthroid 0.25 mg p.o. daily.   Overall, the patient remained stable.   DIAGNOSTIC DATA: Discharge labs: PT-INR was 26.6 and 2.5.   TIME SPENT: 40 minutes.   ____________________________ Wylie HailSona A. Allena KatzPatel, MD sap:sb D: 12/14/2013 07:35:15 ET T: 12/14/2013 07:59:02 ET JOB#: [id number]  cc: Taegen Delker A. Allena KatzPatel, MD, <Dictator> Sandeep R. Sherrlyn HockPandit, MD Willow OraSONA A Coreon Simkins MD ELECTRONICALLY SIGNED 12/26/2013 11:34
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
## 2014-09-24 NOTE — H&P (Signed)
L&D Evaluation:  History:  HPI 29 yo G4P2002 with LMP of 06/17/12 & EDD of  04/21/13 with PNC at [facility] for anemia, , depression, PTSD, Trich 2009, UTI, spider veins here for "nausea, vomiting, diarrhea" feeling poorly. No ROM, VB or decreased FM. No UC's   Presents with nausea/vomiting   Patient's Medical History Anemia, trich, PTSD, Depression   Patient's Surgical History Previous C-Section   Medications Pre Natal Vitamins   Allergies NKDA   Social History tobacco  drugs  MJ   Family History Non-Contributory   ROS:  ROS All systems were reviewed.  HEENT, CNS, GI, GU, Respiratory, CV, Renal and Musculoskeletal systems were found to be normal.   Exam:  Vital Signs stable  Tachy initially   General no apparent distress   Mental Status clear   Chest clear   Heart normal sinus rhythm, no murmur/gallop/rubs   Abdomen gravid, non-tender   Estimated Fetal Weight Average for gestational age   Back no CVAT   Reflexes 1+   Mebranes Intact   FHT normal rate with no decels   Ucx other   Skin dry   Lymph no lymphadenopathy   Impression:  Impression Viral illness   Plan:  Plan fluids, Hydrate   Electronic Signatures: Sharee PimpleJones, Brehanna Deveny W (CNM)  (Signed 09-Dec-14 08:29)  Authored: L&D Evaluation   Last Updated: 09-Dec-14 08:29 by Sharee PimpleJones, Rockland Kotarski W (CNM)

## 2014-11-21 ENCOUNTER — Emergency Department
Admission: EM | Admit: 2014-11-21 | Discharge: 2014-11-21 | Disposition: A | Discharge status: Home / Self Care | Payer: Self-pay | Attending: Emergency Medicine | Admitting: Emergency Medicine

## 2014-11-21 ENCOUNTER — Encounter: Payer: Self-pay | Admitting: Urgent Care

## 2014-11-21 DIAGNOSIS — Y9389 Activity, other specified: Secondary | ICD-10-CM | POA: Insufficient documentation

## 2014-11-21 DIAGNOSIS — Z0471 Encounter for examination and observation following alleged adult physical abuse: Secondary | ICD-10-CM | POA: Insufficient documentation

## 2014-11-21 DIAGNOSIS — Y9289 Other specified places as the place of occurrence of the external cause: Secondary | ICD-10-CM | POA: Insufficient documentation

## 2014-11-21 DIAGNOSIS — Y998 Other external cause status: Secondary | ICD-10-CM | POA: Insufficient documentation

## 2014-11-21 HISTORY — DX: Depression, unspecified: F32.A

## 2014-11-21 HISTORY — DX: Major depressive disorder, single episode, unspecified: F32.9

## 2014-11-21 LAB — URINALYSIS COMPLETE WITH MICROSCOPIC (ARMC ONLY)
Bacteria, UA: NONE SEEN
Bilirubin Urine: NEGATIVE
Glucose, UA: NEGATIVE mg/dL
Hgb urine dipstick: NEGATIVE
Ketones, ur: NEGATIVE mg/dL
Leukocytes, UA: NEGATIVE
NITRITE: NEGATIVE
PROTEIN: NEGATIVE mg/dL
Specific Gravity, Urine: 1.012 (ref 1.005–1.030)
pH: 7 (ref 5.0–8.0)

## 2014-11-21 LAB — POCT PREGNANCY, URINE: PREG TEST UR: NEGATIVE

## 2014-11-21 MED ORDER — LORAZEPAM 1 MG PO TABS
ORAL_TABLET | ORAL | Status: AC
  Administered 2014-11-21: 1 mg via ORAL
  Filled 2014-11-21: qty 1

## 2014-11-21 MED ORDER — PROMETHAZINE HCL 25 MG PO TABS
25.0000 mg | ORAL_TABLET | Freq: Once | ORAL | Status: AC
  Administered 2014-11-21: 25 mg via ORAL

## 2014-11-21 MED ORDER — AZITHROMYCIN 250 MG PO TABS
1000.0000 mg | ORAL_TABLET | Freq: Once | ORAL | Status: AC
  Administered 2014-11-21: 1000 mg via ORAL

## 2014-11-21 MED ORDER — LORAZEPAM 1 MG PO TABS
1.0000 mg | ORAL_TABLET | Freq: Once | ORAL | Status: AC
  Administered 2014-11-21: 1 mg via ORAL

## 2014-11-21 MED ORDER — CEFIXIME 400 MG PO TABS
400.0000 mg | ORAL_TABLET | Freq: Once | ORAL | Status: AC
  Administered 2014-11-21: 400 mg via ORAL

## 2014-11-21 MED ORDER — CEFIXIME 400 MG PO CAPS
ORAL_CAPSULE | ORAL | Status: AC
  Administered 2014-11-21: 400 mg via ORAL
  Filled 2014-11-21: qty 1

## 2014-11-21 MED ORDER — PROMETHAZINE HCL 25 MG PO TABS
ORAL_TABLET | ORAL | Status: AC
  Administered 2014-11-21: 25 mg via ORAL
  Filled 2014-11-21: qty 1

## 2014-11-21 MED ORDER — METRONIDAZOLE 500 MG PO TABS
ORAL_TABLET | ORAL | Status: AC
  Administered 2014-11-21: 2000 mg via ORAL
  Filled 2014-11-21: qty 2

## 2014-11-21 MED ORDER — AZITHROMYCIN 250 MG PO TABS
ORAL_TABLET | ORAL | Status: AC
  Administered 2014-11-21: 1000 mg via ORAL
  Filled 2014-11-21: qty 4

## 2014-11-21 MED ORDER — METRONIDAZOLE 500 MG PO TABS
2000.0000 mg | ORAL_TABLET | Freq: Once | ORAL | Status: AC
  Administered 2014-11-21: 2000 mg via ORAL

## 2014-11-21 MED ORDER — METRONIDAZOLE 500 MG PO TABS
ORAL_TABLET | ORAL | Status: AC
  Filled 2014-11-21: qty 2

## 2014-11-21 NOTE — Discharge Instructions (Signed)
Assault, General  Assault includes any behavior, whether intentional or reckless, which results in bodily injury to another person and/or damage to property. Included in this would be any behavior, intentional or reckless, that by its nature would be understood (interpreted) by a reasonable person as intent to harm another person or to damage his/her property. Threats may be oral or written. They may be communicated through regular mail, computer, fax, or phone. These threats may be direct or implied.  FORMS OF ASSAULT INCLUDE:  · Physically assaulting a person. This includes physical threats to inflict physical harm as well as:  ¨ Slapping.  ¨ Hitting.  ¨ Poking.  ¨ Kicking.  ¨ Punching.  ¨ Pushing.  · Arson.  · Sabotage.  · Equipment vandalism.  · Damaging or destroying property.  · Throwing or hitting objects.  · Displaying a weapon or an object that appears to be a weapon in a threatening manner.  ¨ Carrying a firearm of any kind.  ¨ Using a weapon to harm someone.  · Using greater physical size/strength to intimidate another.  ¨ Making intimidating or threatening gestures.  ¨ Bullying.  ¨ Hazing.  · Intimidating, threatening, hostile, or abusive language directed toward another person.  ¨ It communicates the intention to engage in violence against that person. And it leads a reasonable person to expect that violent behavior may occur.  · Stalking another person.  IF IT HAPPENS AGAIN:  · Immediately call for emergency help (911 in [place]  · If someone poses clear and immediate danger to you, seek legal authorities to have a protective or restraining order put in place.  · Less threatening assaults can at least be reported to authorities.  STEPS TO TAKE IF A SEXUAL ASSAULT HAS HAPPENED  · Go to an area of safety. This may include a shelter or staying with a friend. Stay away from the area where you have been attacked. A large percentage of sexual assaults are caused by a friend, relative or associate.  · If  medications were given by your caregiver, take them as directed for the full length of time prescribed.  · Only take over-the-counter or prescription medicines for pain, discomfort, or fever as directed by your caregiver.  · If you have come in contact with a sexual disease, find out if you are to be tested again. If your caregiver is concerned about the HIV/AIDS virus, he/she may require you to have continued testing for several months.  · For the protection of your privacy, test results can not be given over the phone. Make sure you receive the results of your test. If your test results are not back during your visit, make an appointment with your caregiver to find out the results. Do not assume everything is normal if you have not heard from your caregiver or the medical facility. It is important for you to follow up on all of your test results.  · File appropriate papers with authorities. This is important in all assaults, even if it has occurred in a family or by a friend.  SEEK MEDICAL CARE IF:  · You have new problems because of your injuries.  · You have problems that may be because of the medicine you are taking, such as:  ¨ Rash.  ¨ Itching.  ¨ Swelling.  ¨ Trouble breathing.  · You develop belly (abdominal) pain, feel sick to your stomach (nausea) or are vomiting.  · You begin to run a temperature.  · You   need supportive care or referral to a rape crisis center. These are centers with trained personnel who can help you get through this ordeal.  SEEK IMMEDIATE MEDICAL CARE IF:  · You are afraid of being threatened, beaten, or abused. In [place] call 911.  · You receive new injuries related to abuse.  · You develop severe pain in any area injured in the assault or have any change in your condition that concerns you.  · You faint or lose consciousness.  · You develop chest pain or shortness of breath.  Document Released: 05/03/2005 Document Revised: 07/26/2011 Document Reviewed: 12/20/2007  [employer]® Patient  Information ©2015 [employer]. This information is not intended to replace advice given to you by your health care provider. Make sure you discuss any questions you have with your health care provider.
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
## 2014-11-21 NOTE — ED Notes (Signed)
Pt. Resting in bed at this time. Pt notified that her father is in waiting room. Pt requested for father to wait in waiting room at this time. Bankerfficer and Advocate at bedside with pt at this time. Sane Nurse, Traci currently entered room at this time.
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
## 2015-03-01 IMAGING — CR DG CHEST 2V
1 series · 2 of 2 positions shown · non-contrast
Comparison: May 04, 2013

CLINICAL DATA: Difficulty breathing

EXAM:
CHEST  2 VIEW

[Series 1: pa · 0.17mm/px · 2 of 2 slices shown]
[im 1/2]
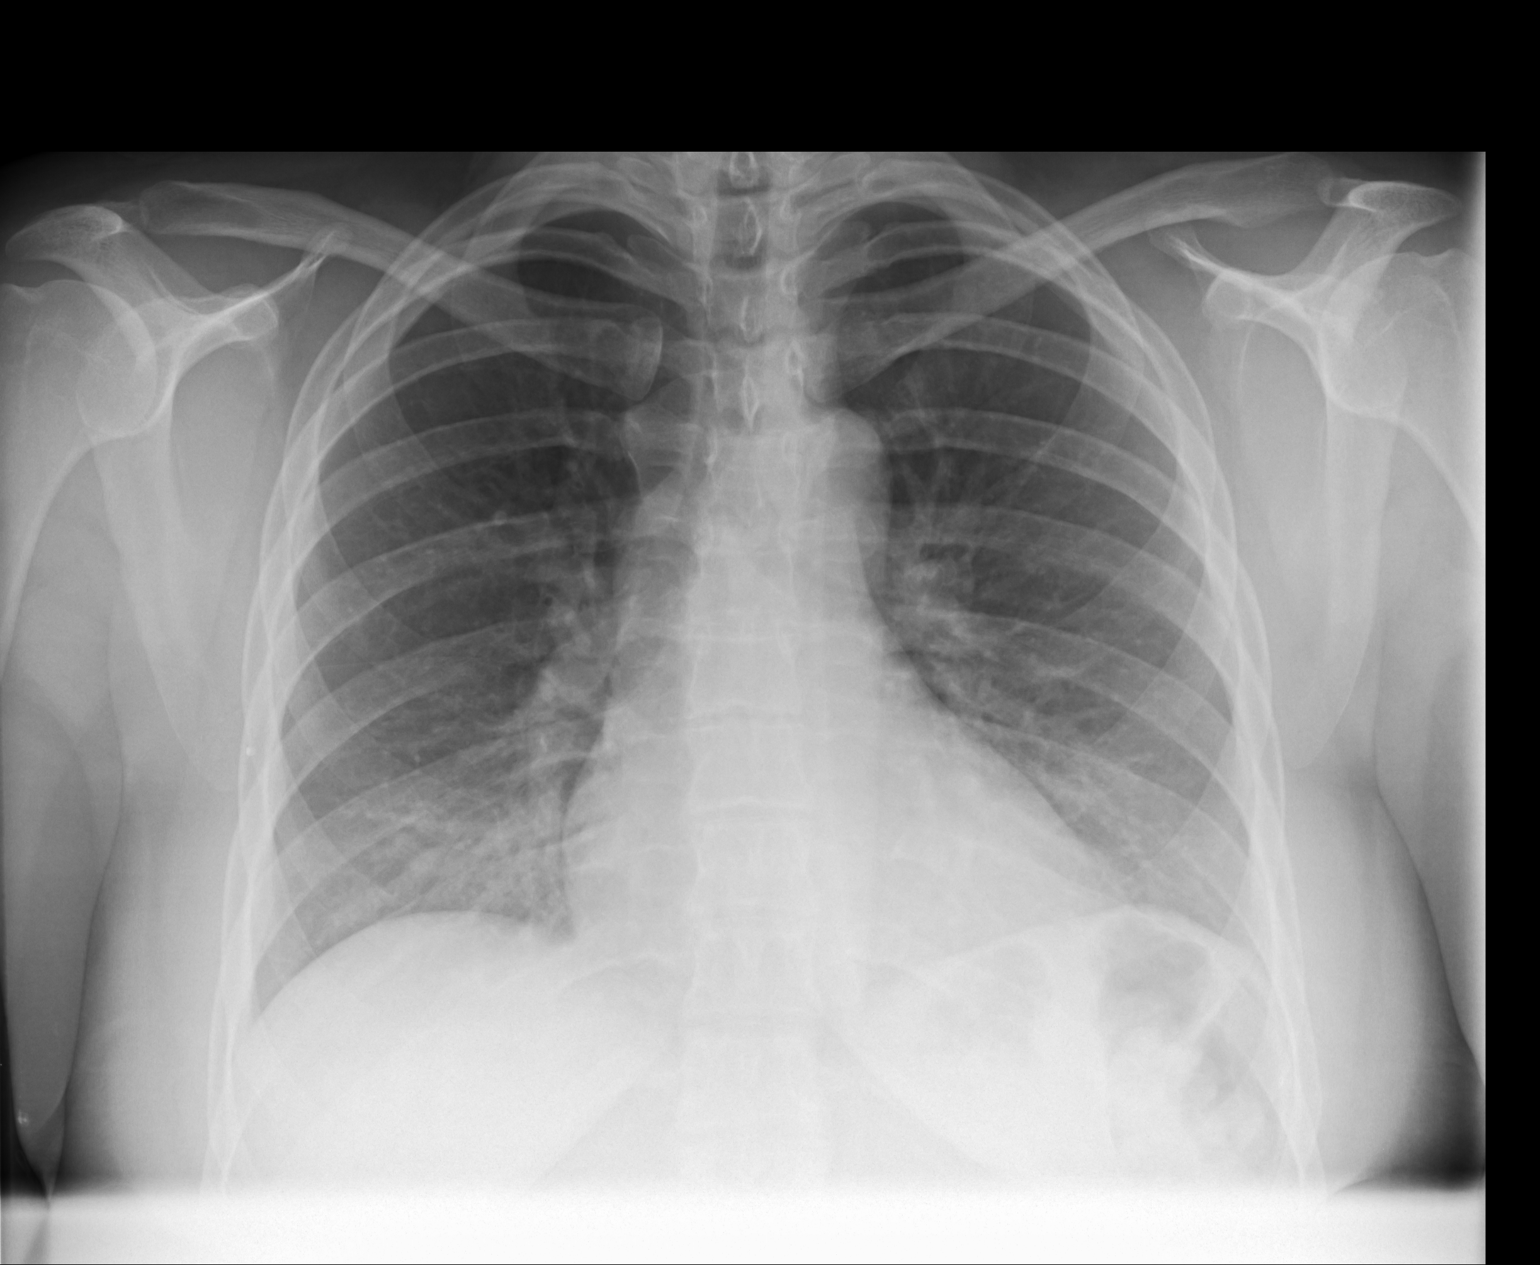
[im 2/2]
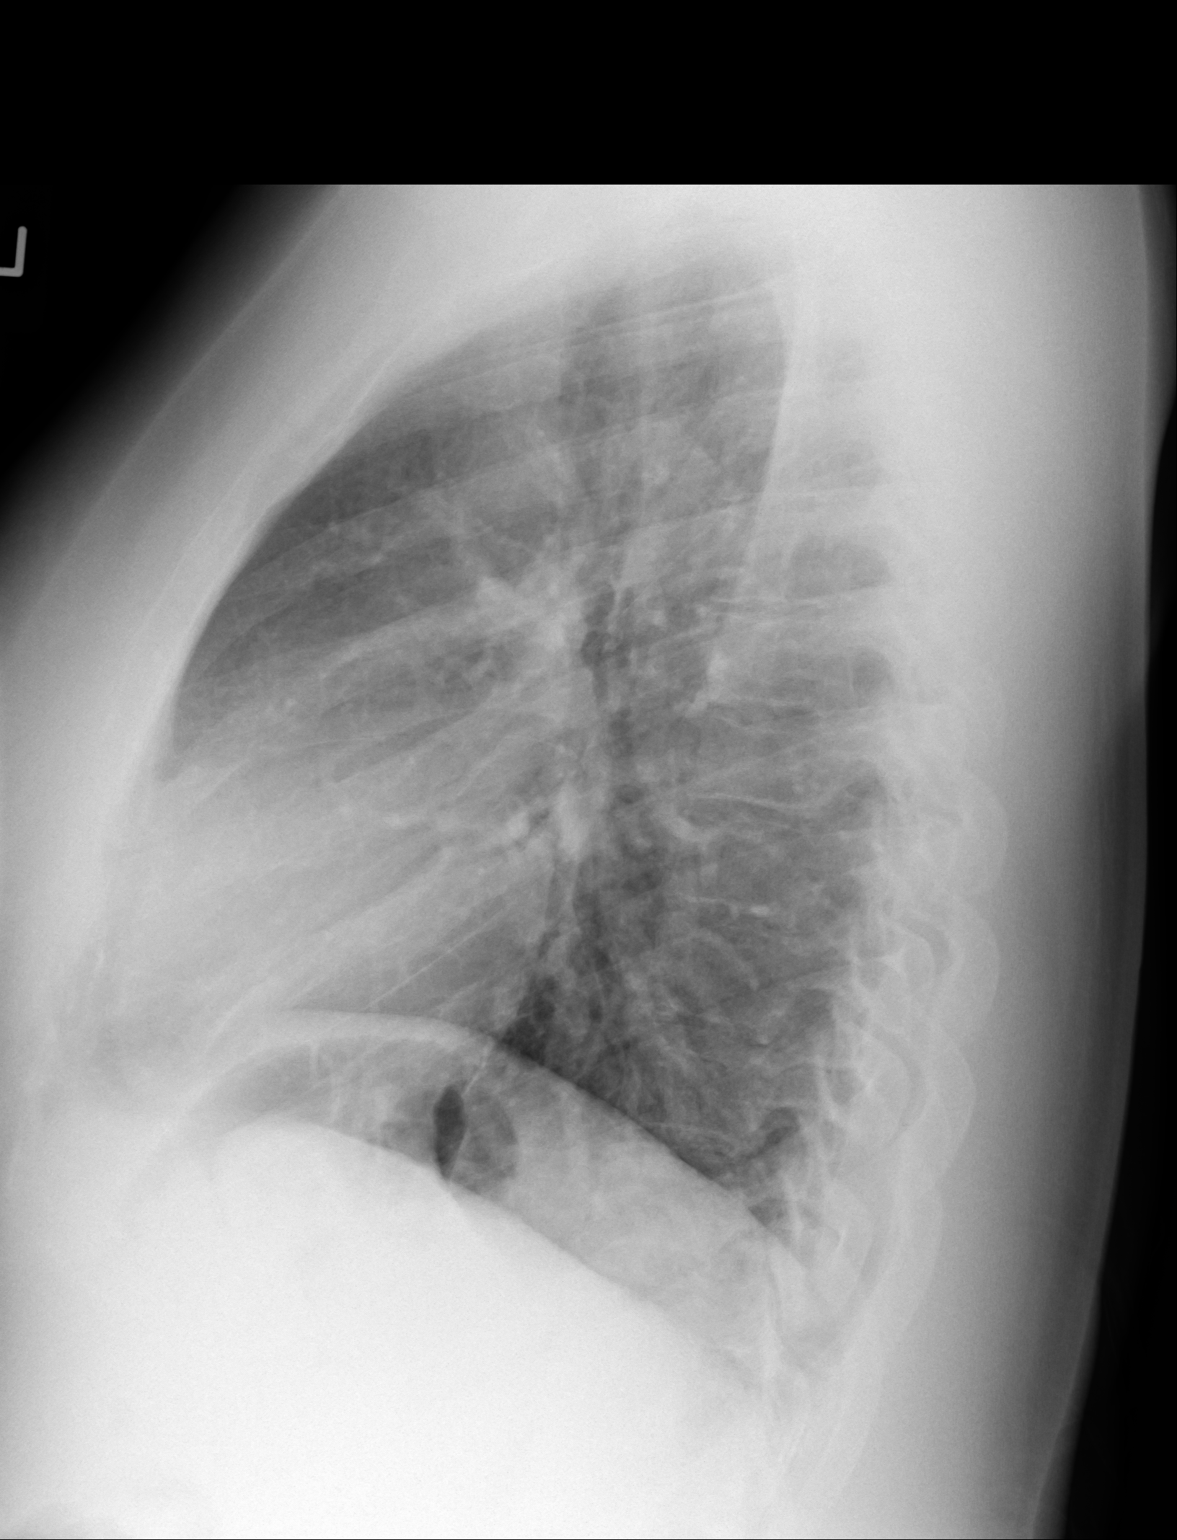

[2 of 2 positions shown; findings below may reference images not displayed]

FINDINGS: Lungs are clear. Heart size and pulmonary vascularity are normal. No
adenopathy. No pneumothorax. No bone lesions.
IMPRESSION: No abnormality noted.

## 2015-03-24 ENCOUNTER — Emergency Department
Admission: EM | Admit: 2015-03-24 | Discharge: 2015-03-24 | Discharge status: Against Medical Advice | Payer: Self-pay | Attending: Emergency Medicine | Admitting: Emergency Medicine

## 2015-03-24 ENCOUNTER — Encounter: Payer: Self-pay | Admitting: Emergency Medicine

## 2015-03-24 DIAGNOSIS — R112 Nausea with vomiting, unspecified: Secondary | ICD-10-CM | POA: Insufficient documentation

## 2015-03-24 DIAGNOSIS — R197 Diarrhea, unspecified: Secondary | ICD-10-CM | POA: Insufficient documentation

## 2015-03-24 DIAGNOSIS — J029 Acute pharyngitis, unspecified: Secondary | ICD-10-CM | POA: Insufficient documentation

## 2015-03-24 DIAGNOSIS — R05 Cough: Secondary | ICD-10-CM | POA: Insufficient documentation

## 2015-03-24 LAB — POCT PREGNANCY, URINE: Preg Test, Ur: NEGATIVE

## 2015-03-24 MED ORDER — ONDANSETRON 4 MG PO TBDP: 4.0000 mg | ORAL_TABLET | Freq: Once | ORAL | Status: DC | PRN

## 2015-03-24 NOTE — ED Notes (Addendum)
Pt called out from waiting room restroom. Pt had vomited and not make it to the toilet in time. Materials management notified. Pt given emesis bag. Protocol for Zofran ordered.

## 2015-03-24 NOTE — ED Notes (Signed)
COUGH, SORE THROAT X 3 WEEKS.  VOMITING AND DIARRHEA STARTED YESTERDAY.

## 2015-07-15 ENCOUNTER — Emergency Department
Admission: EM | Admit: 2015-07-15 | Discharge: 2015-07-15 | Disposition: A | Discharge status: Home / Self Care | Payer: Self-pay | Attending: Emergency Medicine | Admitting: Emergency Medicine

## 2015-07-15 ENCOUNTER — Emergency Department: Payer: Self-pay

## 2015-07-15 DIAGNOSIS — L0231 Cutaneous abscess of buttock: Secondary | ICD-10-CM | POA: Insufficient documentation

## 2015-07-15 DIAGNOSIS — Z3202 Encounter for pregnancy test, result negative: Secondary | ICD-10-CM | POA: Insufficient documentation

## 2015-07-15 DIAGNOSIS — L0291 Cutaneous abscess, unspecified: Secondary | ICD-10-CM

## 2015-07-15 LAB — BASIC METABOLIC PANEL
Anion gap: 7 (ref 5–15)
BUN: 10 mg/dL (ref 6–20)
CALCIUM: 8.2 mg/dL — AB (ref 8.9–10.3)
CO2: 25 mmol/L (ref 22–32)
CREATININE: 0.68 mg/dL (ref 0.44–1.00)
Chloride: 110 mmol/L (ref 101–111)
GFR calc Af Amer: >60 mL/min (ref >60)
Glucose, Bld: 100 mg/dL — ABNORMAL HIGH (ref 65–99)
Potassium: 3.9 mmol/L (ref 3.5–5.1)
Sodium: 142 mmol/L (ref 135–145)

## 2015-07-15 LAB — CBC
HCT: 35.5 % (ref 35.0–47.0)
Hemoglobin: 12 g/dL (ref 12.0–16.0)
MCH: 26.1 pg (ref 26.0–34.0)
MCHC: 33.8 g/dL (ref 32.0–36.0)
MCV: 77.3 fL — AB (ref 80.0–100.0)
Platelets: 226 10*3/uL (ref 150–440)
RBC: 4.59 MIL/uL (ref 3.80–5.20)
RDW: 14.7 % — ABNORMAL HIGH (ref 11.5–14.5)
WBC: 14.3 10*3/uL — ABNORMAL HIGH (ref 3.6–11.0)

## 2015-07-15 LAB — HCG, QUANTITATIVE, PREGNANCY

## 2015-07-15 MED ORDER — CLINDAMYCIN HCL 300 MG PO CAPS: 300.0000 mg | ORAL_CAPSULE | Freq: Three times a day (TID) | ORAL | Status: DC

## 2015-07-15 MED ORDER — OXYCODONE-ACETAMINOPHEN 5-325 MG PO TABS
1.0000 | ORAL_TABLET | Freq: Once | ORAL | Status: AC
  Administered 2015-07-15: 1 via ORAL

## 2015-07-15 MED ORDER — IOHEXOL 300 MG/ML  SOLN
100.0000 mL | Freq: Once | INTRAMUSCULAR | Status: AC | PRN
  Administered 2015-07-15: 100 mL via INTRAVENOUS

## 2015-07-15 MED ORDER — OXYCODONE-ACETAMINOPHEN 5-325 MG PO TABS
ORAL_TABLET | ORAL | Status: AC
  Administered 2015-07-15: 1 via ORAL
  Filled 2015-07-15: qty 1

## 2015-07-15 MED ORDER — CLINDAMYCIN PHOSPHATE 600 MG/50ML IV SOLN
600.0000 mg | Freq: Once | INTRAVENOUS | Status: AC
  Administered 2015-07-15: 600 mg via INTRAVENOUS
  Filled 2015-07-15: qty 50

## 2015-07-15 MED ORDER — OXYCODONE-ACETAMINOPHEN 5-325 MG PO TABS: 1.0000 | ORAL_TABLET | Freq: Four times a day (QID) | ORAL | Status: DC | PRN

## 2015-07-15 NOTE — ED Provider Notes (Signed)
[facility] Emergency Department Provider Note  ____________________________________________  Time seen: Approximately 441 AM  I have reviewed the triage vital signs and the nursing notes.   HISTORY  Chief Complaint Abscess    HPI [name] is a 30 y.o. female who comes into the hospital today with an abscess to her gluteal cleft. The patient reports that she has bumps on her which she's had in the past. 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 The patient reports that she has 2 areas currently that are bothering her. She has one area on her right buttock that she reports is not as sensitive but she has a bump in between her buttock cheeks on the left that is very painful. She said she had 3 other bumps on her thigh and leg that improved after she did some soaks. She reports that the bumps on her butt tach and in between her butt cheeks appear 4 days ago. She has been attempting to soak in hot water and Epsom salts and has been trying to keep it clean. She reports though it's difficult because it is her buttock. The patient also has a stye to her right eye. She rates her pain a 9 out of 10 in intensity. The patient could not tolerate the pain so she came in to the hospital for evaluation.   Past Medical History  Diagnosis Date  . Depression     There are no active problems to display for this patient.   Past Surgical History  Procedure Laterality Date  . Cesarean section      x 3    No current outpatient prescriptions   Allergies Review of patient's allergies indicates no known allergies.  No family history on file.  Social History Social History  Substance Use Topics  . Smoking status: Never Smoker   . Smokeless tobacco: None  . Alcohol Use: Yes    Review of Systems Constitutional: No fever/chills Eyes: No visual changes. ENT: No sore throat. Cardiovascular: Denies chest pain. Respiratory: Denies shortness of  breath. Gastrointestinal: No abdominal pain.  No nausea, no vomiting.  No diarrhea.  No constipation. Genitourinary: Negative for dysuria. Musculoskeletal: Negative for back pain. Skin: Multiple abscesses Neurological: Negative for headaches, focal weakness or numbness.  10-point ROS otherwise negative.  ____________________________________________   PHYSICAL EXAM:  VITAL SIGNS: ED Triage Vitals  Enc Vitals Group     BP 07/15/15 0400 137/99 mmHg     Pulse Rate 07/15/15 0400 102     Resp 07/15/15 0400 18     Temp 07/15/15 0400 98.3 F (36.8 C)     Temp Source 07/15/15 0400 Oral     SpO2 07/15/15 0400 99 %     Weight 07/15/15 0400 180 lb (81.647 kg)     Height 07/15/15 0400  (1.676 m)     Head Cir --      Peak Flow --      Pain Score 07/15/15 0401 10     Pain Loc --      Pain Edu? --      Excl. in GC? --     Constitutional: Alert and oriented. Well appearing and in moderate distress. Eyes: Conjunctivae are normal. PERRL. EOMI. Head: Atraumatic. Nose: No congestion/rhinnorhea. Mouth/Throat: Mucous membranes are moist.  Oropharynx non-erythematous. Cardiovascular: Normal rate, regular rhythm. Grossly normal heart sounds.  Good peripheral circulation. Respiratory: Normal respiratory effort.  No retractions. Lungs CTAB. Gastrointestinal: Soft and nontender. No distention.  Positive bowel sounds Rectal: Abscess to left butt cheek with some swelling going into warts the rectum. Rectal exam performed and is uncomfortable but unable to palpate fluctuance. Musculoskeletal: No lower extremity tenderness nor edema.  Neurologic:  Normal speech and language.  Skin:  Skin is warm, dry and intact. Abscess noted to right butt cheek small with some central area of fluctuance, abscess to left butt cheek in the gluteal cleft with some swelling and induration towards the rectum unable to differentiate rectal abscess versus cutaneous abscess. Psychiatric: Mood and affect are normal.    ____________________________________________   LABS (all labs ordered are listed, but only abnormal results are displayed)  Labs Reviewed  CBC - Abnormal; Notable for the following:    WBC 14.3 (*)    MCV 77.3 (*)    RDW 14.7 (*)    All other components within normal limits  BASIC METABOLIC PANEL - Abnormal; Notable for the following:    Glucose, Bld 100 (*)    Calcium 8.2 (*)    All other components within normal limits  HCG, QUANTITATIVE, PREGNANCY   ____________________________________________  EKG  None ____________________________________________  RADIOLOGY  CT pelvis: Focal soft tissue inflammation tracking about the left side of the anorectal canal and along the left gluteal cleft with suggestion of a tiny 8mm superficial focus of fluid. This may reflect a tiny abscess, though it is likely too small to drain. Mild focal soft tissue inflammation along the right buttock, relatively superficial in nature. No associated fluid seen.  ____________________________________________   PROCEDURES  Procedure(s) performed: None  Critical Care performed: No  ____________________________________________   INITIAL IMPRESSION / ASSESSMENT AND PLAN / ED COURSE  Pertinent labs & imaging results that were available during my care of the patient were reviewed by me and considered in my medical decision making (see chart for details).  This is a 30 year old female who comes into the hospital today with multiple abscesses. The patient reports that she's had it before but this is very painful. I am unable to differentiate if the patient has a perirectal abscess versus a cutaneous abscess on her inner butt tach. I will give the patient an IV and send her for a CT scan of her pelvis to evaluate for abscess. I will give the patient a dose of clindamycin 600 mg IV 1 as well as a Percocet. The patient will be reassessed once I received the results of her CT scan.  Patient CT scan is  unremarkable. She will be discharged home with some antibiotics as well as some pain medicine. She has been instructed to return to the anal swelling worsen. At this time the patient's abscess is too superficial to drain it is too small to drain. We will treat with antibiotics and have her follow-up for further evaluation in 2-3 days. ____________________________________________   FINAL CLINICAL IMPRESSION(S) / ED DIAGNOSES  Final diagnoses:  Abscess      Rebecka Apley, MD 07/15/15 [phone]

## 2015-07-15 NOTE — ED Notes (Addendum)
Pt reports pain remains same 10/10; Warm wet compress applied to abscess per pt request for comfort

## 2015-07-15 NOTE — ED Notes (Signed)
Pt in with abscess to buttocks x 2

## 2015-07-15 NOTE — ED Notes (Signed)
Pt to CT via stretcher accomp by CT tech 

## 2015-07-15 NOTE — ED Notes (Signed)
Pt uprite on stretcher in exam room with no distress noted; reports 2 abscess to buttocks "for awhile" with hx of same; approx quarter-sized area redness/firmness noted to right upper buttock and inside crease of left buttock

## 2015-07-15 NOTE — Discharge Instructions (Signed)

## 2016-07-01 DIAGNOSIS — J069 Acute upper respiratory infection, unspecified: Secondary | ICD-10-CM | POA: Insufficient documentation

## 2016-07-01 LAB — URINALYSIS, COMPLETE (UACMP) WITH MICROSCOPIC
BACTERIA UA: NONE SEEN
Bilirubin Urine: NEGATIVE
Glucose, UA: NEGATIVE mg/dL
Hgb urine dipstick: NEGATIVE
Ketones, ur: NEGATIVE mg/dL
Leukocytes, UA: NEGATIVE
Nitrite: NEGATIVE
PROTEIN: NEGATIVE mg/dL
Specific Gravity, Urine: 1.02 (ref 1.005–1.030)
pH: 6 (ref 5.0–8.0)

## 2016-07-01 LAB — CBC
HEMATOCRIT: 35.1 % (ref 35.0–47.0)
HEMOGLOBIN: 11.7 g/dL — AB (ref 12.0–16.0)
MCH: 26 pg (ref 26.0–34.0)
MCHC: 33.5 g/dL (ref 32.0–36.0)
MCV: 77.5 fL — ABNORMAL LOW (ref 80.0–100.0)
Platelets: 223 10*3/uL (ref 150–440)
RBC: 4.53 MIL/uL (ref 3.80–5.20)
RDW: 13.5 % (ref 11.5–14.5)
WBC: 9.3 10*3/uL (ref 3.6–11.0)

## 2016-07-01 LAB — COMPREHENSIVE METABOLIC PANEL
ALBUMIN: 4.1 g/dL (ref 3.5–5.0)
ALK PHOS: 49 U/L (ref 38–126)
ALT: 17 U/L (ref 14–54)
AST: 19 U/L (ref 15–41)
Anion gap: 5 (ref 5–15)
BUN: 10 mg/dL (ref 6–20)
CALCIUM: 8.1 mg/dL — AB (ref 8.9–10.3)
CO2: 28 mmol/L (ref 22–32)
Chloride: 106 mmol/L (ref 101–111)
Creatinine, Ser: 0.66 mg/dL (ref 0.44–1.00)
GFR calc Af Amer: >60 mL/min (ref >60)
GFR calc non Af Amer: >60 mL/min (ref >60)
GLUCOSE: 97 mg/dL (ref 65–99)
Potassium: 3.4 mmol/L — ABNORMAL LOW (ref 3.5–5.1)
SODIUM: 139 mmol/L (ref 135–145)
Total Bilirubin: 0.4 mg/dL (ref 0.3–1.2)
Total Protein: 6.9 g/dL (ref 6.5–8.1)

## 2016-07-01 LAB — LIPASE, BLOOD: Lipase: 26 U/L (ref 11–51)

## 2016-07-01 LAB — TROPONIN I

## 2016-07-01 NOTE — ED Triage Notes (Signed)
Pt presents to ED via POV with c/o flu-like s/x's x2 weeks. Pt was seen and d/x'd with flu at [facility] 2 weeks ago. Pt presents with recurring s/x's of N/V/D and non-radiating central/left sided CP. Pt is A&O, in NAD, speaking clearly and in complete sentences with no difficulty or distress noted at this time.

## 2016-07-02 ENCOUNTER — Emergency Department: Payer: Self-pay

## 2016-07-02 ENCOUNTER — Emergency Department
Admission: EM | Admit: 2016-07-02 | Discharge: 2016-07-02 | Disposition: A | Discharge status: Home / Self Care | Payer: Self-pay | Attending: Emergency Medicine | Admitting: Emergency Medicine

## 2016-07-02 DIAGNOSIS — B9789 Other viral agents as the cause of diseases classified elsewhere: Secondary | ICD-10-CM

## 2016-07-02 DIAGNOSIS — J069 Acute upper respiratory infection, unspecified: Secondary | ICD-10-CM

## 2016-07-02 HISTORY — DX: Gestational (pregnancy-induced) hypertension without significant proteinuria, unspecified trimester: O13.9

## 2016-07-02 MED ORDER — PREDNISONE 20 MG PO TABS: ORAL_TABLET | ORAL | 0 refills | Status: DC

## 2016-07-02 MED ORDER — HYDROCOD POLST-CPM POLST ER 10-8 MG/5ML PO SUER
5.0000 mL | Freq: Once | ORAL | Status: AC
  Administered 2016-07-02: 5 mL via ORAL
  Filled 2016-07-02: qty 5

## 2016-07-02 MED ORDER — PREDNISONE 20 MG PO TABS
60.0000 mg | ORAL_TABLET | Freq: Once | ORAL | Status: AC
  Administered 2016-07-02: 60 mg via ORAL
  Filled 2016-07-02: qty 3

## 2016-07-02 MED ORDER — HYDROCOD POLST-CPM POLST ER 10-8 MG/5ML PO SUER: 5.0000 mL | Freq: Two times a day (BID) | ORAL | 0 refills | Status: AC

## 2016-07-02 MED ORDER — ALBUTEROL SULFATE HFA 108 (90 BASE) MCG/ACT IN AERS: 2.0000 | INHALATION_SPRAY | RESPIRATORY_TRACT | 0 refills | Status: DC | PRN

## 2016-07-02 NOTE — ED Notes (Signed)
Dr. Sung at the bedside for pt evaluation 

## 2016-07-02 NOTE — ED Provider Notes (Signed)
[facility] Emergency Department Provider Note   ____________________________________________   First MD Initiated Contact with Patient 07/02/16 0234     (approximate)  I have reviewed the triage vital signs and the nursing notes.   HISTORY  Chief Complaint Fever; Chest Pain; and Diarrhea    HPI [name] is a 32 y.o. female who presents to the ED from home with a chief complaint of cough productive of green sputum, nausea/vomiting/diarrhea, left-sided chest pain. Patient reports being diagnosed with influenza at her PCPs office 2 weeks ago. She could not afford Tamiflu. Symptoms improved until 2 days ago when she began to have cough productive of green sputum, wheezing, vomiting/diarrhea, left-sided chest pain which is exacerbated with movement. Reports low-grade temperatures and body aches.Denies associated shortness of breath, abdominal pain, dysuria. Denies recent travel or trauma. Denies hormone use. Patient tells me she had a pulmonary embolism several years ago and was on a blood thinner which was subsequently discontinued several months later. Denies family history of clotting disorders and does not know why she had a blood clot.   Past Medical History:  Diagnosis Date  . Depression   . PIH (pregnancy induced hypertension)     There are no active problems to display for this patient.   Past Surgical History:  Procedure Laterality Date  . CESAREAN SECTION     x 3    Prior to Admission medications   Medication Sig Start Date End Date Taking? Authorizing Provider  albuterol (PROVENTIL HFA;VENTOLIN HFA) 108 (90 Base) MCG/ACT inhaler Inhale 2 puffs into the lungs every 4 (four) hours as needed for wheezing or shortness of breath. 07/02/16   Irean Hong, MD  chlorpheniramine-HYDROcodone (TUSSIONEX PENNKINETIC ER) 10-8 MG/5ML SUER Take 5 mLs by mouth 2 (two) times daily. 07/02/16   Irean Hong, MD  clindamycin (CLEOCIN) 300 MG capsule Take 1  capsule (300 mg total) by mouth 3 (three) times daily. 07/15/15   Rebecka Apley, MD  oxyCODONE-acetaminophen (ROXICET) 5-325 MG tablet Take 1 tablet by mouth every 6 (six) hours as needed. 07/15/15   Rebecka Apley, MD  predniSONE (DELTASONE) 20 MG tablet 3 tablets daily x 4 days 07/02/16   Irean Hong, MD    Allergies Patient has no known allergies.  No family history on file.  Social History Social History  Substance Use Topics  . Smoking status: Never Smoker  . Smokeless tobacco: Never Used  . Alcohol use Yes    Review of Systems  Constitutional: Positive for low-grade fever/chills and generalized body aches. Eyes: No visual changes. ENT: No sore throat. Cardiovascular: Denies chest pain. Respiratory: Positive for productive cough and wheezing. Denies shortness of breath. Gastrointestinal: No abdominal pain.  No nausea, no vomiting.  No diarrhea.  No constipation. Genitourinary: Negative for dysuria. Musculoskeletal: Negative for back pain. Skin: Negative for rash. Neurological: Negative for headaches, focal weakness or numbness.  10-point ROS otherwise negative.  ____________________________________________   PHYSICAL EXAM:  VITAL SIGNS: ED Triage Vitals [07/01/16 2159]  Enc Vitals Group     BP (!) 130/91     Pulse Rate 83     Resp 18     Temp 99 F (37.2 C)     Temp Source Oral     SpO2 100 %     Weight 190 lb (86.2 kg)     Height 5\' 6"  (1.676 m)     Head Circumference      Peak Flow  Pain Score 9     Pain Loc      Pain Edu?      Excl. in GC?     Constitutional: Alert and oriented. Well appearing and in no acute distress. Eyes: Conjunctivae are normal. PERRL. EOMI. Head: Atraumatic. Nose: Congestion/rhinnorhea. Mouth/Throat: Mucous membranes are moist.  Oropharynx mildly erythematous without tonsillar swelling, exudates or peritonsillar abscess. There is no hoarse or muffled voice. There is no drooling. Neck: No stridor.     Hematological/Lymphatic/Immunilogical: Shotty anterior cervical lymphadenopathy. Cardiovascular: Normal rate, regular rhythm. Grossly normal heart sounds.  Good peripheral circulation. Respiratory: Normal respiratory effort.  No retractions. Lungs with scant rhonchi which clears with breathing. Left anterior chest wall tender to palpation and with movement of trunk. Gastrointestinal: Soft and nontender. No distention. No abdominal bruits. No CVA tenderness. Musculoskeletal: No lower extremity tenderness nor edema.  No joint effusions. Neurologic:  Normal speech and language. No gross focal neurologic deficits are appreciated. No gait instability. Skin:  Skin is warm, dry and intact. No rash noted. No petechiae. Psychiatric: Mood and affect are normal. Speech and behavior are normal.  ____________________________________________   LABS (all labs ordered are listed, but only abnormal results are displayed)  Labs Reviewed  COMPREHENSIVE METABOLIC PANEL - Abnormal; Notable for the following:       Result Value   Potassium 3.4 (*)    Calcium 8.1 (*)    All other components within normal limits  CBC - Abnormal; Notable for the following:    Hemoglobin 11.7 (*)    MCV 77.5 (*)    All other components within normal limits  URINALYSIS, COMPLETE (UACMP) WITH MICROSCOPIC - Abnormal; Notable for the following:    Color, Urine YELLOW (*)    APPearance CLEAR (*)    Squamous Epithelial / LPF 0-5 (*)    All other components within normal limits  LIPASE, BLOOD  TROPONIN I   ____________________________________________  EKG  ED ECG REPORT I, Corran Lalone J, the attending physician, personally viewed and interpreted this ECG.   Date: 07/02/2016  EKG Time: 2206  Rate: 74  Rhythm: normal EKG, normal sinus rhythm  Axis: Normal  Intervals:none  ST&T Change: Nonspecific  ____________________________________________  RADIOLOGY  Chest 2 view interpreted per Dr. Sterling Big: No active  cardiopulmonary disease. ____________________________________________   PROCEDURES  Procedure(s) performed: None  Procedures  Critical Care performed: No  ____________________________________________   INITIAL IMPRESSION / ASSESSMENT AND PLAN / ED COURSE  Pertinent labs & imaging results that were available during my care of the patient were reviewed by me and considered in my medical decision making (see chart for details).  31 year old female with viral illness, productive cough with wheezing at home. States this feels nothing like her pulmonary embolus. Review of records demonstrate she was diagnosed with this in 2015, placed on warfarin, notes reports she does have a family history of clotting disorder and she was supposed to follow up with hematology for further workup but it looks like she was lost to follow-up. Low clinical suspicion for pulmonary embolus in the setting of viral illness with bronchitic cough and wheezing/rhonchi. Will initiate prednisone 5 day burst, albuterol inhaler as needed for wheezing/coughing, Tussionex as needed for cough and patient will follow-up with her PCP closely. Strict return precautions given. Patient verbalizes understanding and agrees with plan of care.      ____________________________________________   FINAL CLINICAL IMPRESSION(S) / ED DIAGNOSES  Final diagnoses:  Viral URI with cough      NEW MEDICATIONS STARTED  DURING THIS VISIT:  New Prescriptions   ALBUTEROL (PROVENTIL HFA;VENTOLIN HFA) 108 (90 BASE) MCG/ACT INHALER    Inhale 2 puffs into the lungs every 4 (four) hours as needed for wheezing or shortness of breath.   CHLORPHENIRAMINE-HYDROCODONE (TUSSIONEX PENNKINETIC ER) 10-8 MG/5ML SUER    Take 5 mLs by mouth 2 (two) times daily.   PREDNISONE (DELTASONE) 20 MG TABLET    3 tablets daily x 4 days     Note:  This document was prepared using Dragon voice recognition software and may include unintentional dictation errors.     Irean HongJade J Muranda Coye, MD 07/02/16 [phone]

## 2016-07-02 NOTE — Discharge Instructions (Signed)
1. Take prednisone 60 mg daily 4 days. Start your next dose on Saturday. 2. You may use albuterol inhaler 2 puffs every 4 hours as needed for cough/wheezing. 3. You may take Tussionex as needed for cough. 4. Return to the ER for worsening symptoms, persistent vomiting, difficulty breathing or other concerns.

## 2016-08-26 ENCOUNTER — Encounter: Payer: Self-pay | Admitting: Emergency Medicine

## 2016-08-26 ENCOUNTER — Emergency Department
Admission: EM | Admit: 2016-08-26 | Discharge: 2016-08-26 | Disposition: A | Discharge status: Home / Self Care | Payer: Self-pay | Attending: Student in an Organized Health Care Education/Training Program | Admitting: Student in an Organized Health Care Education/Training Program

## 2016-08-26 DIAGNOSIS — R112 Nausea with vomiting, unspecified: Secondary | ICD-10-CM

## 2016-08-26 DIAGNOSIS — R197 Diarrhea, unspecified: Secondary | ICD-10-CM | POA: Insufficient documentation

## 2016-08-26 DIAGNOSIS — N760 Acute vaginitis: Secondary | ICD-10-CM | POA: Insufficient documentation

## 2016-08-26 DIAGNOSIS — B9689 Other specified bacterial agents as the cause of diseases classified elsewhere: Secondary | ICD-10-CM

## 2016-08-26 DIAGNOSIS — N390 Urinary tract infection, site not specified: Secondary | ICD-10-CM | POA: Insufficient documentation

## 2016-08-26 LAB — WET PREP, GENITAL
Sperm: NONE SEEN
TRICH WET PREP: NONE SEEN
YEAST WET PREP: NONE SEEN

## 2016-08-26 LAB — CBC
HEMATOCRIT: 38.4 % (ref 35.0–47.0)
Hemoglobin: 12.6 g/dL (ref 12.0–16.0)
MCH: 25.8 pg — ABNORMAL LOW (ref 26.0–34.0)
MCHC: 32.8 g/dL (ref 32.0–36.0)
MCV: 78.5 fL — ABNORMAL LOW (ref 80.0–100.0)
PLATELETS: 242 10*3/uL (ref 150–440)
RBC: 4.89 MIL/uL (ref 3.80–5.20)
RDW: 14 % (ref 11.5–14.5)
WBC: 14.2 10*3/uL — AB (ref 3.6–11.0)

## 2016-08-26 LAB — COMPREHENSIVE METABOLIC PANEL
ALT: 13 U/L — AB (ref 14–54)
AST: 17 U/L (ref 15–41)
Albumin: 4.2 g/dL (ref 3.5–5.0)
Alkaline Phosphatase: 52 U/L (ref 38–126)
Anion gap: 6 (ref 5–15)
BILIRUBIN TOTAL: 0.8 mg/dL (ref 0.3–1.2)
BUN: 10 mg/dL (ref 6–20)
CALCIUM: 8.6 mg/dL — AB (ref 8.9–10.3)
CO2: 27 mmol/L (ref 22–32)
Chloride: 106 mmol/L (ref 101–111)
Creatinine, Ser: 0.64 mg/dL (ref 0.44–1.00)
GFR calc Af Amer: >60 mL/min (ref >60)
GFR calc non Af Amer: >60 mL/min (ref >60)
Glucose, Bld: 111 mg/dL — ABNORMAL HIGH (ref 65–99)
POTASSIUM: 3.4 mmol/L — AB (ref 3.5–5.1)
Sodium: 139 mmol/L (ref 135–145)
TOTAL PROTEIN: 7.1 g/dL (ref 6.5–8.1)

## 2016-08-26 LAB — URINALYSIS, COMPLETE (UACMP) WITH MICROSCOPIC
BACTERIA UA: NONE SEEN
BILIRUBIN URINE: NEGATIVE
GLUCOSE, UA: NEGATIVE mg/dL
Ketones, ur: NEGATIVE mg/dL
NITRITE: NEGATIVE
PH: 6 (ref 5.0–8.0)
Protein, ur: 100 mg/dL — AB
SPECIFIC GRAVITY, URINE: 1.023 (ref 1.005–1.030)

## 2016-08-26 LAB — CHLAMYDIA/NGC RT PCR (ARMC ONLY)
Chlamydia Tr: NOT DETECTED
N gonorrhoeae: NOT DETECTED

## 2016-08-26 LAB — LIPASE, BLOOD: Lipase: 19 U/L (ref 11–51)

## 2016-08-26 LAB — PREGNANCY, URINE: Preg Test, Ur: NEGATIVE

## 2016-08-26 MED ORDER — CEPHALEXIN 500 MG PO CAPS: 500.0000 mg | ORAL_CAPSULE | Freq: Two times a day (BID) | ORAL | 0 refills | Status: AC

## 2016-08-26 MED ORDER — ONDANSETRON 4 MG PO TBDP: 4.0000 mg | ORAL_TABLET | Freq: Three times a day (TID) | ORAL | 0 refills | Status: AC | PRN

## 2016-08-26 MED ORDER — METRONIDAZOLE 0.75 % VA GEL: 1.0000 | Freq: Every day | VAGINAL | 0 refills | Status: AC

## 2016-08-26 NOTE — ED Provider Notes (Signed)
[facility] Emergency Department Provider Note ____________________________________________  Time seen: 1403  I have reviewed the triage vital signs and the nursing notes.  HISTORY  Chief Complaint  Emesis; Diarrhea; and Dysuria  HPI [name] is a 31 y.o. female presents to the ED for evaluation of sudden onset of nausea, vomiting, and diarrhea last night. She is concerned that she may have actually had a bad food exposure, noting that her girlfriend had the same symptoms within the same time frame. She describes with and 45 minutes of eating [employer] burger last night, that the girlfriend brought him from work, she experienced the sudden abdominal pain with nausea and vomiting. She describes 2 episodes of nausea and vomiting today. She also notes watery diarrhea with the most recent episode while in the triage area. She denies any fevers, chills, or sweats. She does also note a vaginal discharge over the last several days. She has burning upon urination and that same timeframe. She unable to tolerate liquids today without nausea and vomiting.  Past Medical History:  Diagnosis Date  . Depression   . PIH (pregnancy induced hypertension)     There are no active problems to display for this patient.   Past Surgical History:  Procedure Laterality Date  . CESAREAN SECTION     x 3    Prior to Admission medications   Medication Sig Start Date End Date Taking? Authorizing Provider  albuterol (PROVENTIL HFA;VENTOLIN HFA) 108 (90 Base) MCG/ACT inhaler Inhale 2 puffs into the lungs every 4 (four) hours as needed for wheezing or shortness of breath. 07/02/16   Irean Hong, MD  cephALEXin (KEFLEX) 500 MG capsule Take 1 capsule (500 mg total) by mouth 2 (two) times daily. 08/26/16 09/02/16  Nashley Cordoba V Bacon Alanzo Lamb, PA-C  chlorpheniramine-HYDROcodone (TUSSIONEX PENNKINETIC ER) 10-8 MG/5ML SUER Take 5 mLs by mouth 2 (two) times daily. 07/02/16   Irean Hong, MD   clindamycin (CLEOCIN) 300 MG capsule Take 1 capsule (300 mg total) by mouth 3 (three) times daily. 07/15/15   Rebecka Apley, MD  metroNIDAZOLE (METROGEL VAGINAL) 0.75 % vaginal gel Place 1 Applicatorful vaginally at bedtime. 08/26/16 08/31/16  Stevenson Windmiller V Bacon Olivea Sonnen, PA-C  ondansetron (ZOFRAN ODT) 4 MG disintegrating tablet Take 1 tablet (4 mg total) by mouth every 8 (eight) hours as needed for nausea or vomiting. 08/26/16   Smith Robert Bacon Tytiana Coles, PA-C  oxyCODONE-acetaminophen (ROXICET) 5-325 MG tablet Take 1 tablet by mouth every 6 (six) hours as needed. 07/15/15   Rebecka Apley, MD  predniSONE (DELTASONE) 20 MG tablet 3 tablets daily x 4 days 07/02/16   Irean Hong, MD    Allergies Patient has no known allergies.  No family history on file.  Social History Social History  Substance Use Topics  . Smoking status: Never Smoker  . Smokeless tobacco: Never Used  . Alcohol use Yes    Review of Systems  Constitutional: Negative for fever. Cardiovascular: Negative for chest pain. Respiratory: Negative for shortness of breath. Gastrointestinal: Positive for abdominal pain, vomiting and diarrhea. Genitourinary: Positive for dysuria. Musculoskeletal: Negative for back pain. Skin: Negative for rash. Neurological: Negative for headaches, focal weakness or numbness. ____________________________________________  PHYSICAL EXAM:  VITAL SIGNS: ED Triage Vitals [08/26/16 1242]  Enc Vitals Group     BP (!) 138/93     Pulse Rate 92     Resp 18     Temp 98.8 F (37.1 C)     Temp Source Oral  SpO2 100 %     Weight 185 lb (83.9 kg)     Height  (1.676 m)     Head Circumference      Peak Flow      Pain Score      Pain Loc      Pain Edu?      Excl. in GC?     Constitutional: Alert and oriented. Well appearing and in no distress. Head: Normocephalic and atraumatic. Eyes: Conjunctivae are normal. PERRL. Normal extraocular movements Ears: Canals clear. TMs intact  bilaterally. Nose: No congestion/rhinorrhea/epistaxis. Mouth/Throat: Mucous membranes are moist. Uvula is midline and tonsils are flat.  Neck: Supple. No thyromegaly. Hematological/Lymphatic/Immunological: No cervical lymphadenopathy. Cardiovascular: Normal rate, regular rhythm. Normal distal pulses. Respiratory: Normal respiratory effort. No wheezes/rales/rhonchi. Gastrointestinal: Soft and nontender. No distention, rebound, guarding, or rigidiity. Normal bowel sounds x 4. No CVA tenderness.  GU: normal external genitalia. Dark blood noted in the vagina. Cervix is closed. No CMT or adnexal masses.  Musculoskeletal: Nontender with normal range of motion in all extremities.  Neurologic:  Normal gait without ataxia. Normal speech and language. No gross focal neurologic deficits are appreciated. Skin:  Skin is warm, dry and intact. No rash noted. Psychiatric: Mood and affect are normal. Patient exhibits appropriate insight and judgment. ____________________________________________   LABS (pertinent positives/negatives)  Labs Reviewed  WET PREP, GENITAL - Abnormal; Notable for the following:       Result Value   Clue Cells Wet Prep HPF POC PRESENT (*)    WBC, Wet Prep HPF POC FEW (*)    All other components within normal limits  COMPREHENSIVE METABOLIC PANEL - Abnormal; Notable for the following:    Potassium 3.4 (*)    Glucose, Bld 111 (*)    Calcium 8.6 (*)    ALT 13 (*)    All other components within normal limits  CBC - Abnormal; Notable for the following:    WBC 14.2 (*)    MCV 78.5 (*)    MCH 25.8 (*)    All other components within normal limits  URINALYSIS, COMPLETE (UACMP) WITH MICROSCOPIC - Abnormal; Notable for the following:    Color, Urine YELLOW (*)    APPearance CLOUDY (*)    Hgb urine dipstick MODERATE (*)    Protein, ur 100 (*)    Leukocytes, UA LARGE (*)    Squamous Epithelial / LPF 0-5 (*)    All other components within normal limits  CHLAMYDIA/NGC RT PCR ([facility]  ONLY)  URINE CULTURE  LIPASE, BLOOD  PREGNANCY, URINE  ____________________________________________  INITIAL IMPRESSION / ASSESSMENT AND PLAN / ED COURSE  Female patient with complaints of nausea, vomiting, and diarrhea for less than 24-hours. Her exam is benign and labs are normal with the exception of leukocyturia/hematuria, and clue cells confirmed on wet prep. Her symptoms may represent sequealea of an acute cystitis, but the similarly reported symptoms in her partner are suspicious for an acute gastroenteritis. She will be discharged with prescriptions for Metrogel, Keflex, and Zofran. She will follow-up with her provider or return to the ED as needed.  ____________________________________________  FINAL CLINICAL IMPRESSION(S) / ED DIAGNOSES  Final diagnoses:  BV (bacterial vaginosis)  Lower urinary tract infectious disease  Nausea vomiting and diarrhea      Lissa Hoard, PA-C 08/26/16 1839    Willy Eddy, MD 08/26/16 2115

## 2016-08-26 NOTE — ED Notes (Signed)
See triage note  States she developed some n/v/d after eating last pm   States some sx's have subsided  But also now having some burning with urination

## 2016-08-26 NOTE — ED Triage Notes (Signed)
Pt reports NVD for two to three days. Pt reports pain and burning upon urination. Pt denies abdominal pain.

## 2016-08-26 NOTE — Discharge Instructions (Signed)
Your labs confirm a common bacterial infection in women. You also have a bladder infection. This can cause nausea and vomiting. Your symptoms may also represent a mild food poisoning. Continue to monitor symptoms. Eat carb-rich foods to slow the diarrhea. Take the prescription meds as directed. Follow-up with your provider as needed. Return to the ED for worsening symptoms.

## 2016-08-28 LAB — URINE CULTURE
Culture: >=100000 — AB
SPECIAL REQUESTS: NORMAL

## 2017-04-06 ENCOUNTER — Emergency Department: Payer: Self-pay

## 2017-04-06 ENCOUNTER — Other Ambulatory Visit: Payer: Self-pay

## 2017-04-06 ENCOUNTER — Emergency Department
Admission: EM | Admit: 2017-04-06 | Discharge: 2017-04-06 | Disposition: A | Discharge status: Home / Self Care | Payer: Self-pay | Attending: Emergency Medicine | Admitting: Emergency Medicine

## 2017-04-06 ENCOUNTER — Encounter: Payer: Self-pay | Admitting: *Deleted

## 2017-04-06 DIAGNOSIS — H532 Diplopia: Secondary | ICD-10-CM | POA: Insufficient documentation

## 2017-04-06 DIAGNOSIS — H43393 Other vitreous opacities, bilateral: Secondary | ICD-10-CM | POA: Insufficient documentation

## 2017-04-06 DIAGNOSIS — Z79899 Other long term (current) drug therapy: Secondary | ICD-10-CM | POA: Insufficient documentation

## 2017-04-06 LAB — COMPREHENSIVE METABOLIC PANEL
ALT: 11 U/L — ABNORMAL LOW (ref 14–54)
ANION GAP: 8 (ref 5–15)
AST: 17 U/L (ref 15–41)
Albumin: 4.4 g/dL (ref 3.5–5.0)
Alkaline Phosphatase: 52 U/L (ref 38–126)
BILIRUBIN TOTAL: 1.2 mg/dL (ref 0.3–1.2)
BUN: 9 mg/dL (ref 6–20)
CHLORIDE: 104 mmol/L (ref 101–111)
CO2: 25 mmol/L (ref 22–32)
Calcium: 9 mg/dL (ref 8.9–10.3)
Creatinine, Ser: 0.73 mg/dL (ref 0.44–1.00)
Glucose, Bld: 103 mg/dL — ABNORMAL HIGH (ref 65–99)
POTASSIUM: 3.3 mmol/L — AB (ref 3.5–5.1)
Sodium: 137 mmol/L (ref 135–145)
TOTAL PROTEIN: 7.5 g/dL (ref 6.5–8.1)

## 2017-04-06 LAB — CBC WITH DIFFERENTIAL/PLATELET
BASOS ABS: 0.1 10*3/uL (ref 0–0.1)
Basophils Relative: 1 %
EOS PCT: 3 %
Eosinophils Absolute: 0.3 10*3/uL (ref 0–0.7)
HEMATOCRIT: 38.8 % (ref 35.0–47.0)
Hemoglobin: 13.1 g/dL (ref 12.0–16.0)
LYMPHS PCT: 31 %
Lymphs Abs: 3.1 10*3/uL (ref 1.0–3.6)
MCH: 26.1 pg (ref 26.0–34.0)
MCHC: 33.7 g/dL (ref 32.0–36.0)
MCV: 77.5 fL — AB (ref 80.0–100.0)
MONO ABS: 0.9 10*3/uL (ref 0.2–0.9)
MONOS PCT: 9 %
Neutro Abs: 5.5 10*3/uL (ref 1.4–6.5)
Neutrophils Relative %: 56 %
PLATELETS: 180 10*3/uL (ref 150–440)
RBC: 5 MIL/uL (ref 3.80–5.20)
RDW: 13.6 % (ref 11.5–14.5)
WBC: 9.8 10*3/uL (ref 3.6–11.0)

## 2017-04-06 LAB — POCT PREGNANCY, URINE: PREG TEST UR: NEGATIVE

## 2017-04-06 MED ORDER — ASPIRIN 81 MG PO CHEW
324.0000 mg | CHEWABLE_TABLET | Freq: Once | ORAL | Status: AC
  Administered 2017-04-06: 324 mg via ORAL
  Filled 2017-04-06: qty 4

## 2017-04-06 NOTE — ED Notes (Signed)
[facility]

## 2017-04-06 NOTE — ED Notes (Signed)
Pt in MRI.

## 2017-04-06 NOTE — ED Triage Notes (Signed)
Pt reports visual field changes, grips equal, face symmerical

## 2017-04-06 NOTE — ED Notes (Signed)
Pt has no deficit with peripheral vision but is having difficulty with focus bilaterally.

## 2017-04-06 NOTE — ED Triage Notes (Signed)
Pt complains of cough and congestion since yesterday, pt reports seeing floaters today

## 2017-04-06 NOTE — ED Provider Notes (Signed)
[facility] Provider Note  ____________________________________________  Time seen: Approximately 1:15 PM  I have reviewed the triage vital signs and the nursing notes.   HISTORY  Chief Complaint Visual Field Change   HPI [name] is a 31 y.o. female  No significant past medical history who presents for evaluation of visual floaters. Patient reports that she woke up this morning at 7 AM with bilateral flashing lights and visual floaters which have been constant and unchanged for several hours. She has been unable to see well. Symptoms present with both eyes closed, both open, or just one eye open. Patient denies any prior visual problems. She denies headache, slurred speech, facial droop, unilateral weakness or numbness, difficulty finding words. No personal or family history of stroke. Patient reports that yesterday she smoked cocaine for the first time in her life. That happened at 7 PM and she went to bed at midnight and at that time she was asymptomatic.  Past Medical History:  Diagnosis Date  . Depression   . PIH (pregnancy induced hypertension)     There are no active problems to display for this patient.   Past Surgical History:  Procedure Laterality Date  . CESAREAN SECTION     x 3    Prior to Admission medications   Medication Sig Start Date End Date Taking? Authorizing Provider  albuterol (PROVENTIL HFA;VENTOLIN HFA) 108 (90 Base) MCG/ACT inhaler Inhale 2 puffs into the lungs every 4 (four) hours as needed for wheezing or shortness of breath. Patient not taking: Reported on 04/06/2017 07/02/16   Irean HongSung, Jade J, MD  chlorpheniramine-HYDROcodone [facility](TUSSIONEX PENNKINETIC ER) 10-8 MG/5ML SUER Take 5 mLs by mouth 2 (two) times daily. Patient not taking: Reported on 04/06/2017 07/02/16   Irean HongSung, Jade J, MD  ondansetron (ZOFRAN ODT) 4 MG disintegrating tablet Take 1 tablet (4 mg total) by mouth every 8 (eight) hours as needed for  nausea or vomiting. Patient not taking: Reported on 04/06/2017 08/26/16   Menshew, Charlesetta IvoryJenise V Bacon, PA-C  oxyCODONE-acetaminophen (ROXICET) 5-325 MG tablet Take 1 tablet by mouth every 6 (six) hours as needed. Patient not taking: Reported on 04/06/2017 07/15/15   Rebecka ApleyWebster, Allison P, MD  predniSONE (DELTASONE) 20 MG tablet 3 tablets daily x 4 days Patient not taking: Reported on 04/06/2017 07/02/16   Irean HongSung, Jade J, MD    Allergies Patient has no known allergies.  No family history on file.  Social History Social History   Tobacco Use  . Smoking status: Never Smoker  . Smokeless tobacco: Never Used  Substance Use Topics  . Alcohol use: Yes  . Drug use: Not on file    Review of Systems  Constitutional: Negative for fever. Eyes: + visual changes. ENT: Negative for sore throat. Neck: No neck pain  Cardiovascular: Negative for chest pain. Respiratory: Negative for shortness of breath. Gastrointestinal: Negative for abdominal pain, vomiting or diarrhea. Genitourinary: Negative for dysuria. Musculoskeletal: Negative for back pain. Skin: Negative for rash. Neurological: Negative for headaches, weakness or numbness. Psych: No SI or HI  ____________________________________________   PHYSICAL EXAM:  VITAL SIGNS: ED Triage Vitals  Enc Vitals Group     BP 04/06/17 1052 (!) 147/96     Pulse Rate 04/06/17 1052 88     Resp 04/06/17 1052 18     Temp 04/06/17 1052 99.4 F (37.4 C)     Temp Source 04/06/17 1052 Oral     SpO2 04/06/17 1052 98 %     Weight  04/06/17 1052 189 lb (85.7 kg)     Height 04/06/17 1052 5\' 6"  (1.676 m)     Head Circumference --      Peak Flow --      Pain Score 04/06/17 1051 4     Pain Loc --      Pain Edu? --      Excl. in GC? --     Constitutional: Alert and oriented. Well appearing and in no apparent distress. HEENT:      Head: Normocephalic and atraumatic.         Eyes: Conjunctivae are normal. Sclera is non-icteric. Visual acuity 20/70  bilaterally      Mouth/Throat: Mucous membranes are moist.       Neck: Supple with no signs of meningismus. Cardiovascular: Regular rate and rhythm. No murmurs, gallops, or rubs. 2+ symmetrical distal pulses are present in all extremities. No JVD. Respiratory: Normal respiratory effort. Lungs are clear to auscultation bilaterally. No wheezes, crackles, or rhonchi.  Gastrointestinal: Soft, non tender, and non distended with positive bowel sounds. No rebound or guarding. Genitourinary: No CVA tenderness. Musculoskeletal: Nontender with normal range of motion in all extremities. No edema, cyanosis, or erythema of extremities. Neurologic: Normal speech and language. A & O x3, PERRL, EOMI, no nystagmus, CN II-XII intact, motor testing reveals good tone and bulk throughout. There is no evidence of pronator drift or dysmetria. Muscle strength is 5/5 throughout. Deep tendon reflexes are 2+ throughout with downgoing toes. Sensory examination is intact. Gait is normal. Skin: Skin is warm, dry and intact. No rash noted. Psychiatric: Mood and affect are normal. Speech and behavior are normal.  ____________________________________________   LABS (all labs ordered are listed, but only abnormal results are displayed)  Labs Reviewed  CBC WITH DIFFERENTIAL/PLATELET - Abnormal; Notable for the following components:      Result Value   MCV 77.5 (*)    All other components within normal limits  COMPREHENSIVE METABOLIC PANEL - Abnormal; Notable for the following components:   Potassium 3.3 (*)    Glucose, Bld 103 (*)    ALT 11 (*)    All other components within normal limits  POCT PREGNANCY, URINE   ____________________________________________  EKG  none  ____________________________________________  RADIOLOGY  CT head: Negative  MRI brain: negative  ____________________________________________   PROCEDURES  Procedure(s) performed: None Procedures Critical Care performed:   None ____________________________________________   INITIAL IMPRESSION / ASSESSMENT AND PLAN / ED COURSE  31 y.o. female with No significant past medical history who presents for evaluation of visual floaters/ flashing lights on bilateral eyes since she woke up this morning at 7 AM after smoking cocaine last night. Neurologically intact, visual acuity 20/70 bilaterally. CT head and MRI with no evidence of stroke. Spoke with Dr. Lindon Romp, ophthalmologist on call who recommended emergent evaluation in the clinic to rule out retinal infarct in the setting of cocaine. Dr. Inez Pilgrim will be at the clinic after 2PM to see patient. Patient was given full dose ASA and will be sent to [facility] now.      As part of my medical decision making, I reviewed the following data within the electronic MEDICAL RECORD NUMBER Nursing notes reviewed and incorporated, Radiograph reviewed , A consult was requested and obtained from this/these consultant(s) Ophthalmology, Notes from prior ED visits and [place] Controlled Substance Database    Pertinent labs & imaging results that were available during my care of the patient were reviewed by me and considered in my  medical decision making (see chart for details).    ____________________________________________   FINAL CLINICAL IMPRESSION(S) / ED DIAGNOSES  Final diagnoses:  Visual floaters, bilateral      NEW MEDICATIONS STARTED DURING THIS VISIT:  This SmartLink is deprecated. Use AVSMEDLIST instead to display the medication list for a patient.   Note:  This document was prepared using Dragon voice recognition software and may include unintentional dictation errors.    Nita SickleVeronese, [place], MD 04/06/17 1324

## 2017-04-06 NOTE — ED Notes (Signed)
Pt back from MRI at this time

## 2017-04-06 NOTE — ED Notes (Signed)
Patient transported to MRI 

## 2017-04-06 NOTE — ED Notes (Signed)
Pt discharged home after verbalizing understanding of discharge instructions; nad noted. 

## 2017-04-06 NOTE — ED Notes (Signed)
Pt presents with visual changes. She states she is unable to read her cell phone text messages or drive. She did have cough and congestion yesterday. Denies headache, difficulty ambulating, current pain. NAD noted.

## 2017-05-08 ENCOUNTER — Encounter: Payer: Self-pay | Admitting: Intensive Care

## 2017-05-08 ENCOUNTER — Emergency Department
Admission: EM | Admit: 2017-05-08 | Discharge: 2017-05-08 | Disposition: A | Discharge status: Home / Self Care | Payer: Medicaid Other | Attending: Student in an Organized Health Care Education/Training Program | Admitting: Student in an Organized Health Care Education/Training Program

## 2017-05-08 DIAGNOSIS — R300 Dysuria: Secondary | ICD-10-CM | POA: Diagnosis present

## 2017-05-08 DIAGNOSIS — N3000 Acute cystitis without hematuria: Secondary | ICD-10-CM

## 2017-05-08 LAB — URINALYSIS, COMPLETE (UACMP) WITH MICROSCOPIC
BACTERIA UA: NONE SEEN
Bilirubin Urine: NEGATIVE
GLUCOSE, UA: NEGATIVE mg/dL
HGB URINE DIPSTICK: NEGATIVE
KETONES UR: 5 mg/dL — AB
NITRITE: NEGATIVE
PROTEIN: 100 mg/dL — AB
Specific Gravity, Urine: 1.035 — ABNORMAL HIGH (ref 1.005–1.030)
pH: 5 (ref 5.0–8.0)

## 2017-05-08 LAB — POCT PREGNANCY, URINE: PREG TEST UR: NEGATIVE

## 2017-05-08 MED ORDER — CEPHALEXIN 500 MG PO CAPS: 500.0000 mg | ORAL_CAPSULE | Freq: Three times a day (TID) | ORAL | 0 refills | Status: AC

## 2017-05-08 MED ORDER — CEPHALEXIN 500 MG PO CAPS
500.0000 mg | ORAL_CAPSULE | Freq: Once | ORAL | Status: AC
  Administered 2017-05-08: 500 mg via ORAL
  Filled 2017-05-08: qty 1

## 2017-05-08 NOTE — ED Triage Notes (Signed)
Patient reports for over a week she has been having pain when she urinates, lower back pain, and foul smelling urine. Ambulatory with no problems.

## 2017-05-08 NOTE — ED Provider Notes (Signed)
[facility] Emergency Department Provider Note    First MD Initiated Contact with Patient 05/08/17 1503     (approximate)  I have reviewed the triage vital signs and the nursing notes.   HISTORY  Chief Complaint Urinary Tract Infection    HPI [name] is a 31 y.o. female with a history of urinary tract infections most recently being 1 year ago presents with dysuria and increased frequency for the past week now describing increasing foul-smelling urine and low back pain.  No fevers.  No nausea or vomiting.  Denies any vaginal bleeding or discharge.  She is sexually active with a female partner.  No history of STI's.  It feels similar to previous urinary tract infections.  Past Medical History:  Diagnosis Date  . Depression   . PIH (pregnancy induced hypertension)    History reviewed. No pertinent family history. Past Surgical History:  Procedure Laterality Date  . CESAREAN SECTION     x 3   There are no active problems to display for this patient.     Prior to Admission medications   Medication Sig Start Date End Date Taking? Authorizing Provider  albuterol (PROVENTIL HFA;VENTOLIN HFA) 108 (90 Base) MCG/ACT inhaler Inhale 2 puffs into the lungs every 4 (four) hours as needed for wheezing or shortness of breath. Patient not taking: Reported on 04/06/2017 07/02/16   Irean HongSung, Jade J, MD  cephALEXin (KEFLEX) 500 MG capsule Take 1 capsule (500 mg total) by mouth 3 (three) times daily for 7 days. 05/08/17 05/15/17  Willy Eddyobinson, Arianna Delsanto, MD  chlorpheniramine-HYDROcodone (TUSSIONEX PENNKINETIC ER) 10-8 MG/5ML SUER Take 5 mLs by mouth 2 (two) times daily. Patient not taking: Reported on 04/06/2017 07/02/16   Irean HongSung, Jade J, MD  ondansetron (ZOFRAN ODT) 4 MG disintegrating tablet Take 1 tablet (4 mg total) by mouth every 8 (eight) hours as needed for nausea or vomiting. Patient not taking: Reported on 04/06/2017 08/26/16   Menshew, Charlesetta IvoryJenise V Bacon, PA-C    oxyCODONE-acetaminophen (ROXICET) 5-325 MG tablet Take 1 tablet by mouth every 6 (six) hours as needed. Patient not taking: Reported on 04/06/2017 07/15/15   Rebecka ApleyWebster, Allison P, MD  predniSONE (DELTASONE) 20 MG tablet 3 tablets daily x 4 days Patient not taking: Reported on 04/06/2017 07/02/16   Irean HongSung, Jade J, MD    Allergies Patient has no known allergies.    Social History Social History   Tobacco Use  . Smoking status: Never Smoker  . Smokeless tobacco: Never Used  Substance Use Topics  . Alcohol use: Yes  . Drug use: No    Review of Systems Patient denies headaches, rhinorrhea, blurry vision, numbness, shortness of breath, chest pain, edema, cough, abdominal pain, nausea, vomiting, diarrhea, dysuria, fevers, rashes or hallucinations unless otherwise stated above in HPI. ____________________________________________   PHYSICAL EXAM:  VITAL SIGNS: Vitals:   05/08/17 1318 05/08/17 1530  BP: (!) 149/105 (!) 134/93  Pulse: 86 76  Resp: 16 16  Temp: 98.3 F (36.8 C)   SpO2: 99% 100%    Constitutional: Alert and oriented. Well appearing and in no acute distress. Eyes: Conjunctivae are normal.  Head: Atraumatic. Nose: No congestion/rhinnorhea. Mouth/Throat: Mucous membranes are moist.   Neck: Painless ROM.  Cardiovascular:   Good peripheral circulation. Respiratory: Normal respiratory effort.  No retractions.  Gastrointestinal: Soft and nontender.  Musculoskeletal: No lower extremity tenderness .  No joint effusions. Neurologic:  Normal speech and language. No gross focal neurologic deficits are appreciated.  Skin:  Skin is  warm, dry and intact. No rash noted. Psychiatric: Mood and affect are normal. Speech and behavior are normal.  ____________________________________________   LABS (all labs ordered are listed, but only abnormal results are displayed)  Results for orders placed or performed during the hospital encounter of 05/08/17 (from the past 24 hour(s))   Urinalysis, Complete w Microscopic     Status: Abnormal   Collection Time: 05/08/17  1:19 PM  Result Value Ref Range   Color, Urine YELLOW (A) YELLOW   APPearance HAZY (A) CLEAR   Specific Gravity, Urine 1.035 (H) 1.005 - 1.030   pH 5.0 5.0 - 8.0   Glucose, UA NEGATIVE NEGATIVE mg/dL   Hgb urine dipstick NEGATIVE NEGATIVE   Bilirubin Urine NEGATIVE NEGATIVE   Ketones, ur 5 (A) NEGATIVE mg/dL   Protein, ur 161100 (A) NEGATIVE mg/dL   Nitrite NEGATIVE NEGATIVE   Leukocytes, UA MODERATE (A) NEGATIVE   RBC / HPF 6-30 0 - 5 RBC/hpf   WBC, UA TOO NUMEROUS TO COUNT 0 - 5 WBC/hpf   Bacteria, UA NONE SEEN NONE SEEN   Squamous Epithelial / LPF 0-5 (A) NONE SEEN   Mucus PRESENT    Non Squamous Epithelial 0-5 (A) NONE SEEN  Pregnancy, urine POC     Status: None   Collection Time: 05/08/17  1:28 PM  Result Value Ref Range   Preg Test, Ur NEGATIVE NEGATIVE   ______________________________________________________________________________________   PROCEDURES  Procedure(s) performed:  Procedures    Critical Care performed: no ____________________________________________   INITIAL IMPRESSION / ASSESSMENT AND PLAN / ED COURSE  Pertinent labs & imaging results that were available during my care of the patient were reviewed by me and considered in my medical decision making (see chart for details).  DDX: UTI, ectopic, STD, [place]  [name] is a 31 y.o. who presents to the ED with symptoms as described above.  She is afebrile and well-appearing.  Her abdominal exam is soft and benign.  Urinalysis is consistent with urinary tract infection.  She has had similar episodes in the past will treat for urinary tract infection with Keflex.  No indication for further diagnostic testing or imaging given her well appearance.  Discussed signs and symptoms for which she should return immediately to the hospital.      ____________________________________________   FINAL CLINICAL IMPRESSION(S)  / ED DIAGNOSES  Final diagnoses:  Acute cystitis without hematuria      NEW MEDICATIONS STARTED DURING THIS VISIT:  This SmartLink is deprecated. Use AVSMEDLIST instead to display the medication list for a patient.   Note:  This document was prepared using Dragon voice recognition software and may include unintentional dictation errors.     Willy Eddyobinson, Palyn Scrima, MD 05/08/17 [phone]

## 2017-06-20 ENCOUNTER — Emergency Department: Payer: Self-pay

## 2017-06-20 ENCOUNTER — Emergency Department
Admission: EM | Admit: 2017-06-20 | Discharge: 2017-06-20 | Disposition: A | Discharge status: Home / Self Care | Payer: Self-pay | Attending: Emergency Medicine | Admitting: Emergency Medicine

## 2017-06-20 ENCOUNTER — Other Ambulatory Visit: Payer: Self-pay

## 2017-06-20 DIAGNOSIS — J111 Influenza due to unidentified influenza virus with other respiratory manifestations: Secondary | ICD-10-CM | POA: Insufficient documentation

## 2017-06-20 LAB — CBC WITH DIFFERENTIAL/PLATELET
BASOS PCT: 0 %
Basophils Absolute: 0 10*3/uL (ref 0–0.1)
EOS ABS: 0 10*3/uL (ref 0–0.7)
Eosinophils Relative: 1 %
HCT: 35.8 % (ref 35.0–47.0)
HEMOGLOBIN: 12.2 g/dL (ref 12.0–16.0)
Lymphocytes Relative: 5 %
Lymphs Abs: 0.4 10*3/uL — ABNORMAL LOW (ref 1.0–3.6)
MCH: 26.5 pg (ref 26.0–34.0)
MCHC: 34 g/dL (ref 32.0–36.0)
MCV: 78 fL — ABNORMAL LOW (ref 80.0–100.0)
MONOS PCT: 10 %
Monocytes Absolute: 0.7 10*3/uL (ref 0.2–0.9)
NEUTROS PCT: 84 %
Neutro Abs: 6.3 10*3/uL (ref 1.4–6.5)
Platelets: 221 10*3/uL (ref 150–440)
RBC: 4.6 MIL/uL (ref 3.80–5.20)
RDW: 14.5 % (ref 11.5–14.5)
WBC: 7.4 10*3/uL (ref 3.6–11.0)

## 2017-06-20 LAB — URINALYSIS, COMPLETE (UACMP) WITH MICROSCOPIC
BILIRUBIN URINE: NEGATIVE
Bacteria, UA: NONE SEEN
GLUCOSE, UA: NEGATIVE mg/dL
Hgb urine dipstick: NEGATIVE
KETONES UR: NEGATIVE mg/dL
LEUKOCYTES UA: NEGATIVE
Nitrite: NEGATIVE
PH: 7 (ref 5.0–8.0)
Protein, ur: NEGATIVE mg/dL
Specific Gravity, Urine: 1.021 (ref 1.005–1.030)

## 2017-06-20 LAB — COMPREHENSIVE METABOLIC PANEL
ALBUMIN: 4 g/dL (ref 3.5–5.0)
ALK PHOS: 48 U/L (ref 38–126)
ALT: 15 U/L (ref 14–54)
ANION GAP: 8 (ref 5–15)
AST: 21 U/L (ref 15–41)
BUN: 6 mg/dL (ref 6–20)
CO2: 23 mmol/L (ref 22–32)
Calcium: 8.5 mg/dL — ABNORMAL LOW (ref 8.9–10.3)
Chloride: 108 mmol/L (ref 101–111)
Creatinine, Ser: 0.78 mg/dL (ref 0.44–1.00)
GFR calc Af Amer: >60 mL/min (ref >60)
GFR calc non Af Amer: >60 mL/min (ref >60)
GLUCOSE: 109 mg/dL — AB (ref 65–99)
POTASSIUM: 3.4 mmol/L — AB (ref 3.5–5.1)
SODIUM: 139 mmol/L (ref 135–145)
Total Bilirubin: 0.4 mg/dL (ref 0.3–1.2)
Total Protein: 7.4 g/dL (ref 6.5–8.1)

## 2017-06-20 LAB — PREGNANCY, URINE: Preg Test, Ur: NEGATIVE

## 2017-06-20 LAB — GROUP A STREP BY PCR: Group A Strep by PCR: NOT DETECTED

## 2017-06-20 LAB — POCT PREGNANCY, URINE: PREG TEST UR: NEGATIVE

## 2017-06-20 MED ORDER — IBUPROFEN 800 MG PO TABS: 800.0000 mg | ORAL_TABLET | Freq: Three times a day (TID) | ORAL | 0 refills | Status: DC | PRN

## 2017-06-20 MED ORDER — HYDROCOD POLST-CPM POLST ER 10-8 MG/5ML PO SUER: 5.0000 mL | Freq: Two times a day (BID) | ORAL | 0 refills | Status: AC

## 2017-06-20 MED ORDER — KETOROLAC TROMETHAMINE 30 MG/ML IJ SOLN
10.0000 mg | Freq: Once | INTRAMUSCULAR | Status: AC
  Administered 2017-06-20: 9.9 mg via INTRAVENOUS
  Filled 2017-06-20: qty 1

## 2017-06-20 MED ORDER — KETOROLAC TROMETHAMINE 60 MG/2ML IM SOLN
60.0000 mg | Freq: Once | INTRAMUSCULAR | Status: AC
  Administered 2017-06-20: 60 mg via INTRAMUSCULAR
  Filled 2017-06-20: qty 2

## 2017-06-20 MED ORDER — DIAZEPAM 5 MG PO TABS
5.0000 mg | ORAL_TABLET | Freq: Once | ORAL | Status: AC
  Administered 2017-06-20: 5 mg via ORAL
  Filled 2017-06-20: qty 1

## 2017-06-20 MED ORDER — SODIUM CHLORIDE 0.9 % IV BOLUS (SEPSIS)
1000.0000 mL | Freq: Once | INTRAVENOUS | Status: AC
  Administered 2017-06-20: 1000 mL via INTRAVENOUS

## 2017-06-20 NOTE — ED Notes (Signed)
Pt masked in triage and educated on mask wearing. Pt demonstrates understanding.

## 2017-06-20 NOTE — ED Provider Notes (Signed)
[facility] Emergency Department Provider Note       Time seen: ----------------------------------------- 8:39 AM on 06/20/2017 -----------------------------------------   I have reviewed the triage vital signs and the nursing notes.  HISTORY   Chief Complaint Influenza  HPI [name] is a 32 y.o. female with a history of depression who presents to the ED for influenza-like symptoms.  Patient states both of her children have the flu and she arrives stating her whole body hurts, she has fever, cough, congestion.  She has had nausea but no vomiting or diarrhea.  Her myalgias 8 out of 10 and generalized.  Past Medical History:  Diagnosis Date  . Depression   . PIH (pregnancy induced hypertension)     There are no active problems to display for this patient.   Past Surgical History:  Procedure Laterality Date  . CESAREAN SECTION     x 3    Allergies Patient has no known allergies.  Social History Social History   Tobacco Use  . Smoking status: Never Smoker  . Smokeless tobacco: Never Used  Substance Use Topics  . Alcohol use: Yes  . Drug use: No    Review of Systems Constitutional: Positive for fevers, chills, aches Cardiovascular: Negative for chest pain. Respiratory: Negative for shortness of breath.  Positive for cough Gastrointestinal: Negative for abdominal pain, positive for nausea Musculoskeletal: Positive for back pain Skin: Negative for rash. Neurological: Negative for headaches, focal weakness or numbness.  All systems negative/normal/unremarkable except as stated in the HPI  ____________________________________________   PHYSICAL EXAM:  VITAL SIGNS: ED Triage Vitals  Enc Vitals Group     BP 06/20/17 0617 (!) 155/102     Pulse Rate 06/20/17 0617 (!) 109     Resp 06/20/17 0617 18     Temp 06/20/17 0617 (!) 102.3 F (39.1 C)     Temp src --      SpO2 06/20/17 0617 100 %     Weight 06/20/17 0618 183 lb (83 kg)      Height 06/20/17 0618 5\' 6"  (1.676 m)     Head Circumference --      Peak Flow --      Pain Score 06/20/17 0618 8     Pain Loc --      Pain Edu? --      Excl. in GC? --     Constitutional: Alert and oriented. Well appearing and in no distress. Eyes: Conjunctivae are normal. Normal extraocular movements. ENT   Head: Normocephalic and atraumatic.   Nose: No congestion/rhinnorhea.   Mouth/Throat: Mucous membranes are moist.   Neck: No stridor. Cardiovascular: Normal rate, regular rhythm. No murmurs, rubs, or gallops. Respiratory: Normal respiratory effort without tachypnea nor retractions. Breath sounds are clear and equal bilaterally. No wheezes/rales/rhonchi. Gastrointestinal: Soft and nontender. Normal bowel sounds Musculoskeletal: Nontender with normal range of motion in extremities. No lower extremity tenderness nor edema. Neurologic:  Normal speech and language. No gross focal neurologic deficits are appreciated.  Skin:  Skin is warm, dry and intact. No rash noted. Psychiatric: Mood and affect are normal. Speech and behavior are normal.  ____________________________________________  ED COURSE:  As part of my medical decision making, I reviewed the following data within the electronic MEDICAL RECORD NUMBER History obtained from family if available, nursing notes, old chart and ekg, as well as notes from prior ED visits. Patient presented for flulike symptoms, we will assess with labs and treat symptomatically.   Procedures ____________________________________________   LABS (  pertinent positives/negatives)  Labs Reviewed  CBC WITH DIFFERENTIAL/PLATELET - Abnormal; Notable for the following components:      Result Value   MCV 78.0 (*)    Lymphs Abs 0.4 (*)    All other components within normal limits  COMPREHENSIVE METABOLIC PANEL - Abnormal; Notable for the following components:   Potassium 3.4 (*)    Glucose, Bld 109 (*)    Calcium 8.5 (*)    All other  components within normal limits  URINALYSIS, COMPLETE (UACMP) WITH MICROSCOPIC - Abnormal; Notable for the following components:   Color, Urine YELLOW (*)    APPearance CLEAR (*)    Squamous Epithelial / LPF 0-5 (*)    All other components within normal limits  GROUP A STREP BY PCR  PREGNANCY, URINE  POCT PREGNANCY, URINE    RADIOLOGY Chest x-ray is normal  ____________________________________________  DIFFERENTIAL DIAGNOSIS   URI, pneumonia, influenza  FINAL ASSESSMENT AND PLAN  Influenza   Plan: Patient had presented for flulike symptoms. Patient's labs are unremarkable. Patient's imaging was also negative.  She has multiple days into the sickness and Tamiflu would likely not be beneficial.  She also states she cannot afford it.  She was given NSAIDs and a muscle relaxant here.  She is stable for outpatient follow-up.   Ulice Dash, MD   Note: This note was generated in part or whole with voice recognition software. Voice recognition is usually quite accurate but there are transcription errors that can and very often do occur. I apologize for any typographical errors that were not detected and corrected.     Emily Filbert, MD 06/20/17 [phone]

## 2017-06-20 NOTE — ED Triage Notes (Signed)
Pt states she has influenza contacts in home. Pt arrives to triage tearful, states legs hurt. Pt states she has generalized body aches, fever, cough, nasal congestion. Pt states has had nausea, no vomiting of diarrhea. Pt is pale. Last tylenol containing product one hour ago, last motrin one hour ago.

## 2017-08-08 ENCOUNTER — Encounter: Payer: Self-pay | Admitting: Emergency Medicine

## 2017-08-08 ENCOUNTER — Emergency Department
Admission: EM | Admit: 2017-08-08 | Discharge: 2017-08-08 | Disposition: A | Discharge status: Home / Self Care | Payer: Medicaid Other | Attending: Emergency Medicine | Admitting: Emergency Medicine

## 2017-08-08 ENCOUNTER — Other Ambulatory Visit: Payer: Self-pay

## 2017-08-08 DIAGNOSIS — R109 Unspecified abdominal pain: Secondary | ICD-10-CM | POA: Insufficient documentation

## 2017-08-08 DIAGNOSIS — R112 Nausea with vomiting, unspecified: Secondary | ICD-10-CM | POA: Insufficient documentation

## 2017-08-08 DIAGNOSIS — F17210 Nicotine dependence, cigarettes, uncomplicated: Secondary | ICD-10-CM | POA: Insufficient documentation

## 2017-08-08 DIAGNOSIS — R197 Diarrhea, unspecified: Secondary | ICD-10-CM | POA: Insufficient documentation

## 2017-08-08 LAB — COMPREHENSIVE METABOLIC PANEL
ALBUMIN: 4.4 g/dL (ref 3.5–5.0)
ALT: 17 U/L (ref 14–54)
AST: 18 U/L (ref 15–41)
Alkaline Phosphatase: 53 U/L (ref 38–126)
Anion gap: 9 (ref 5–15)
BUN: 13 mg/dL (ref 6–20)
CHLORIDE: 108 mmol/L (ref 101–111)
CO2: 23 mmol/L (ref 22–32)
CREATININE: 0.75 mg/dL (ref 0.44–1.00)
Calcium: 8.8 mg/dL — ABNORMAL LOW (ref 8.9–10.3)
GFR calc non Af Amer: >60 mL/min (ref >60)
GLUCOSE: 104 mg/dL — AB (ref 65–99)
Potassium: 4.3 mmol/L (ref 3.5–5.1)
SODIUM: 140 mmol/L (ref 135–145)
Total Bilirubin: 0.7 mg/dL (ref 0.3–1.2)
Total Protein: 7.8 g/dL (ref 6.5–8.1)

## 2017-08-08 LAB — URINALYSIS, COMPLETE (UACMP) WITH MICROSCOPIC
Bacteria, UA: NONE SEEN
Bilirubin Urine: NEGATIVE
Glucose, UA: NEGATIVE mg/dL
Hgb urine dipstick: NEGATIVE
KETONES UR: 20 mg/dL — AB
Leukocytes, UA: NEGATIVE
Nitrite: NEGATIVE
PH: 6 (ref 5.0–8.0)
Protein, ur: NEGATIVE mg/dL
Specific Gravity, Urine: 1.021 (ref 1.005–1.030)

## 2017-08-08 LAB — CBC
HCT: 37.4 % (ref 35.0–47.0)
Hemoglobin: 12.3 g/dL (ref 12.0–16.0)
MCH: 25.9 pg — AB (ref 26.0–34.0)
MCHC: 33 g/dL (ref 32.0–36.0)
MCV: 78.5 fL — AB (ref 80.0–100.0)
PLATELETS: 249 10*3/uL (ref 150–440)
RBC: 4.76 MIL/uL (ref 3.80–5.20)
RDW: 14.1 % (ref 11.5–14.5)
WBC: 9.5 10*3/uL (ref 3.6–11.0)

## 2017-08-08 LAB — POCT PREGNANCY, URINE: Preg Test, Ur: NEGATIVE

## 2017-08-08 LAB — LIPASE, BLOOD: LIPASE: 26 U/L (ref 11–51)

## 2017-08-08 MED ORDER — LOPERAMIDE HCL 2 MG PO CAPS
4.0000 mg | ORAL_CAPSULE | Freq: Once | ORAL | Status: AC
  Administered 2017-08-08: 4 mg via ORAL
  Filled 2017-08-08: qty 2

## 2017-08-08 MED ORDER — LOPERAMIDE HCL 2 MG PO TABS: 2.0000 mg | ORAL_TABLET | Freq: Four times a day (QID) | ORAL | 0 refills | Status: AC | PRN

## 2017-08-08 MED ORDER — PROMETHAZINE HCL 25 MG PO TABS: 25.0000 mg | ORAL_TABLET | Freq: Four times a day (QID) | ORAL | 0 refills | Status: AC | PRN

## 2017-08-08 MED ORDER — ONDANSETRON HCL 4 MG/2ML IJ SOLN
4.0000 mg | Freq: Once | INTRAMUSCULAR | Status: AC
  Administered 2017-08-08: 4 mg via INTRAVENOUS
  Filled 2017-08-08: qty 2

## 2017-08-08 MED ORDER — SODIUM CHLORIDE 0.9 % IV BOLUS
1000.0000 mL | Freq: Once | INTRAVENOUS | Status: AC
  Administered 2017-08-08: 1000 mL via INTRAVENOUS

## 2017-08-08 NOTE — ED Notes (Signed)
ED Provider at bedside. 

## 2017-08-08 NOTE — Discharge Instructions (Addendum)
OPTIONS FOR DENTAL FOLLOW UP CARE ° °[place] Department of Health and Human Services - Local Safety Net Dental Clinics °http://www.ncdhhs.gov/dph/oralhealth/services/safetynetclinics.htm °  °[facility] (336-562-3123) ° °[facility] [place] ([phone]) ° °[facility] ([phone] ext [phone]) ° °[facility] ([phone]) ° °[facility] ([phone]) °This clinic caters to the indigent population and is on a lottery system. °Location: °[facility], [address], [place] °Clinic Hours: °Wednesdays from 6pm - 9pm, patients seen by a lottery system. °For dates, call or go to [email or web address]/[facility]/[email or web address] °Services: °Cleanings, fillings and simple extractions. °Payment Options: °DENTAL WORK IS FREE OF CHARGE. Bring proof of income or support. °Best way to get seen: °Arrive at 5:15 pm - this is a lottery, NOT first come/first serve, so arriving earlier will not increase your chances of being seen. °  °  °[facility] °[phone] °Select option 1 for emergencies °  °Location: °[facility], [address], [place] °Clinic Hours: °No walk-ins accepted - call the day before to schedule an appointment. °Check in times are 9:30 am and 1:30 pm. °Services: °Simple extractions, temporary fillings, pulpectomy/pulp debridement, uncomplicated abscess drainage. °Payment Options: °PAYMENT IS DUE AT THE TIME OF SERVICE.  Fee is usually $100-200, additional surgical procedures (e.g. abscess drainage) may be extra. °Cash, checks, Visa/MasterCard accepted.  Can file Medicaid if patient is covered for dental - patient should call case worker to check. °No discount for [facility] patients. °Best way to get seen: °MUST call the day before and get onto the schedule. Can usually be seen the next 1-2 days. No walk-ins accepted. °  °  °[facility] °[phone] °   °Location: °[facility], [address], [place] °Clinic Hours: °M, W, Th, F 8am or 1:30pm, Tues 9a or 1:30 - first come/first served. °Services: °Simple extractions, temporary fillings, uncomplicated abscess drainage.  You do not need to be an [place] resident. °Payment Options: °PAYMENT IS DUE AT THE TIME OF SERVICE. °Dental insurance, otherwise sliding scale - bring proof of income or support. °Depending on income and treatment needed, cost is usually $50-200. °Best way to get seen: °Arrive early as it is first come/first served. °  °  °[facility] °[phone] °  °Location: °[address] °Clinic Hours: °Mon-Thu 8a-5p °Services: °Most basic dental services including extractions and fillings. °Payment Options: °PAYMENT IS DUE AT THE TIME OF SERVICE. °Sliding scale, up to 50% off - bring proof if income or support. °Medicaid with dental option accepted. °Best way to get seen: °Call to schedule an appointment, can usually be seen within 2 weeks OR they will try to see walk-ins - show up at 8a or 2p (you may have to wait). °  °  °[facility] °919-245-2435 °[place] RESIDENTS ONLY °  °Location: °[facility], [address], [place], [place] [address] °Clinic Hours: By appointment only. °Monday - Thursday 8am-5pm, Friday 8am-12pm °Services: Cleanings, fillings, extractions. °Payment Options: °PAYMENT IS DUE AT THE TIME OF SERVICE. °Cash, Visa or MasterCard. Sliding scale - $30 minimum per service. °Best way to get seen: °Come in to office, complete packet and make an appointment - need proof of income °or support monies for each household member and proof of [place] residence. °Usually takes about a month to get in. °  °  °[facility] °[phone] °  °Location: °[address]   [place] °Clinic Hours: Walk-in Urgent Care Dental Services are offered Monday-Friday  mornings only. °The numbers of emergencies accepted daily is limited to the number of °providers available. °Maximum 15 - Mondays, Wednesdays & Thursdays °Maximum 10 - Tuesdays & Fridays °Services: °You do not need to be a [place] resident to be seen for a dental emergency. °Emergencies are defined as pain, swelling, abnormal bleeding, or dental trauma. Walkins will receive x-rays if needed. °NOTE: Dental cleaning is not an emergency. °Payment Options: °PAYMENT IS DUE AT THE TIME OF SERVICE. °Minimum co-pay is $40.00 for uninsured patients. °Minimum co-pay is $3.00 for Medicaid with dental coverage. °Dental Insurance is accepted and must be presented at time of visit. °Medicare does not cover dental. °Forms of payment: Cash, credit card, checks. °Best way to get seen: °If not previously registered with the clinic, walk-in dental registration begins at 7:15 am and is on a first come/first serve basis. °If previously registered with the clinic, call to make an appointment. °  °  °[facility] °[phone] °[place] RESIDENTS ONLY °  °Location: °[address], [place] °Clinic Hours: °Mon-Thu 10a-2p °Services: Extractions only! °Payment Options: °FREE (donations accepted) - bring proof of income or support °Best way to get seen: °Call and schedule an appointment OR come at 8am on the 1st Monday of every month (except for holidays) when it is first come/first served. °  °  °[facility] °[phone] °  °Location: °[address], [place] °Clinic Hours: °Friday mornings °Services, Payment Options, Best way to get seen: °Call for info °

## 2017-08-08 NOTE — ED Triage Notes (Signed)
Pt reports that she has had N/V/D for the last several days. States that her diarrhea is yellow and liquid. Has not been able to keep anything down for the last few days. Having Mid abd pain.

## 2017-08-08 NOTE — ED Provider Notes (Signed)
[facility] Emergency Department Provider Note  Time seen: 5:21 PM  I have reviewed the triage vital signs and the nursing notes.   HISTORY  Chief Complaint Abdominal Pain and Diarrhea    HPI [name] is a 32 y.o. female with a past medical history of depression who presents to the emergency department for nausea vomiting diarrhea.  According to the patient for the past 24-48 hours she has been nauseated with episodes of vomiting, intermittent episodes of diarrhea which she describes as a yellow-green color.  States abdominal cramping but denies any focal "abdominal pain."  States subjective fever at home, denies any fever recently.  Negative chest pain, no cough or congestion, largely negative review of systems otherwise.  Patient states she was at work today and had to leave due to frequent episodes of diarrhea.   Past Medical History:  Diagnosis Date  . Depression   . PIH (pregnancy induced hypertension)     There are no active problems to display for this patient.   Past Surgical History:  Procedure Laterality Date  . CESAREAN SECTION     x 3    Prior to Admission medications   Medication Sig Start Date End Date Taking? Authorizing Provider  albuterol (PROVENTIL HFA;VENTOLIN HFA) 108 (90 Base) MCG/ACT inhaler Inhale 2 puffs into the lungs every 4 (four) hours as needed for wheezing or shortness of breath. Patient not taking: Reported on 04/06/2017 07/02/16   Irean Hong, MD  chlorpheniramine-HYDROcodone [facility] ER) 10-8 MG/5ML SUER Take 5 mLs by mouth 2 (two) times daily. Patient not taking: Reported on 04/06/2017 07/02/16   Irean Hong, MD  chlorpheniramine-HYDROcodone [facility] ER) 10-8 MG/5ML SUER Take 5 mLs by mouth 2 (two) times daily. 06/20/17   Emily Filbert, MD  ibuprofen (ADVIL,MOTRIN) 800 MG tablet Take 1 tablet (800 mg total) by mouth every 8 (eight) hours as needed. 06/20/17   Emily Filbert, MD   ondansetron (ZOFRAN ODT) 4 MG disintegrating tablet Take 1 tablet (4 mg total) by mouth every 8 (eight) hours as needed for nausea or vomiting. Patient not taking: Reported on 04/06/2017 08/26/16   Menshew, Charlesetta Ivory, PA-C  oxyCODONE-acetaminophen (ROXICET) 5-325 MG tablet Take 1 tablet by mouth every 6 (six) hours as needed. Patient not taking: Reported on 04/06/2017 07/15/15   Rebecka Apley, MD  predniSONE (DELTASONE) 20 MG tablet 3 tablets daily x 4 days Patient not taking: Reported on 04/06/2017 07/02/16   Irean Hong, MD    No Known Allergies  No family history on file.  Social History Social History   Tobacco Use  . Smoking status: Current Every Day Smoker    Packs/day: 0.50    Types: Cigarettes  . Smokeless tobacco: Never Used  Substance Use Topics  . Alcohol use: Yes  . Drug use: No    Review of Systems Constitutional: Subjective fever Eyes: Negative for visual complaints ENT: Negative for recent illness/congestion Cardiovascular: Negative for chest pain. Respiratory: Negative for shortness of breath.  Negative for cough Gastrointestinal: Intermittent abdominal cramping.  Positive for nausea vomiting and diarrhea.  Negative for black or bloody stool. Genitourinary: Negative for dysuria or hematuria. Musculoskeletal: Negative for musculoskeletal complaints Skin: Negative for skin complaints  Neurological: Negative for headache All other ROS negative  ____________________________________________   PHYSICAL EXAM:  VITAL SIGNS: ED Triage Vitals  Enc Vitals Group     BP 08/08/17 1550 137/90     Pulse Rate 08/08/17 1550 72  Resp --      Temp 08/08/17 1550 98.3 F (36.8 C)     Temp Source 08/08/17 1550 Oral     SpO2 08/08/17 1550 100 %     Weight 08/08/17 1551 178 lb (80.7 kg)     Height 08/08/17 1551 5\' 6"  (1.676 m)     Head Circumference --      Peak Flow --      Pain Score 08/08/17 1556 6     Pain Loc --      Pain Edu? --      Excl. in GC?  --    Constitutional: Alert and oriented. Well appearing and in no distress. Eyes: Normal exam ENT   Head: Normocephalic and atraumatic.   Mouth/Throat: Mucous membranes are moist. Cardiovascular: Normal rate, regular rhythm. No murmur Respiratory: Normal respiratory effort without tachypnea nor retractions. Breath sounds are clear  Gastrointestinal: Soft, slight epigastric tenderness to palpation.  No rebound or guarding.  No distention. Musculoskeletal: Nontender with normal range of motion in all extremities.  Neurologic:  Normal speech and language. No gross focal neurologic deficits  Skin:  Skin is warm, dry and intact.  Psychiatric: Mood and affect are normal.   ____________________________________________   INITIAL IMPRESSION / ASSESSMENT AND PLAN / ED COURSE  Pertinent labs & imaging results that were available during my care of the patient were reviewed by me and considered in my medical decision making (see chart for details).  Patient presents to the emergency department for nausea vomiting and diarrhea for the past 24-48 hours.  Differential would include gastroenteritis, enteritis, pancreatitis, gallbladder disease, infectious etiology.  Overall the patient appears very well, very slight epigastric tenderness on exam which she describes as a cramping type discomfort.  Labs are largely within normal limits including a normal white blood cell count, normal anion gap.  Urinalysis does show a small amount of ketones otherwise normal.  We will IV hydrate, treat with Zofran and Imodium/loperamide.  Patient agreeable to this plan of care.  Highly suspect viral gastroenteritis.  Discussed proper handwashing and bleach cleaning solutions.  Patient states she is feeling much better after medications and fluids.  She also states she has been having some intermittent tooth pain as well states this is been ongoing intermittent for months, recommended she follow-up with a dentist for  further evaluation.  Will provide a list of dental options.  ____________________________________________   FINAL CLINICAL IMPRESSION(S) / ED DIAGNOSES  Gastroenteritis    Minna AntisPaduchowski, Dashley Monts, MD 08/08/17 [phone]

## 2017-11-08 ENCOUNTER — Encounter: Payer: Self-pay | Admitting: Emergency Medicine

## 2017-11-08 ENCOUNTER — Emergency Department: Payer: Medicaid Other

## 2017-11-08 ENCOUNTER — Other Ambulatory Visit: Payer: Self-pay

## 2017-11-08 DIAGNOSIS — Y9301 Activity, walking, marching and hiking: Secondary | ICD-10-CM | POA: Insufficient documentation

## 2017-11-08 DIAGNOSIS — Z23 Encounter for immunization: Secondary | ICD-10-CM | POA: Insufficient documentation

## 2017-11-08 DIAGNOSIS — X500XXA Overexertion from strenuous movement or load, initial encounter: Secondary | ICD-10-CM | POA: Insufficient documentation

## 2017-11-08 DIAGNOSIS — F17210 Nicotine dependence, cigarettes, uncomplicated: Secondary | ICD-10-CM | POA: Insufficient documentation

## 2017-11-08 DIAGNOSIS — Y999 Unspecified external cause status: Secondary | ICD-10-CM | POA: Insufficient documentation

## 2017-11-08 DIAGNOSIS — S92511B Displaced fracture of proximal phalanx of right lesser toe(s), initial encounter for open fracture: Secondary | ICD-10-CM | POA: Insufficient documentation

## 2017-11-08 DIAGNOSIS — Y929 Unspecified place or not applicable: Secondary | ICD-10-CM | POA: Insufficient documentation

## 2017-11-08 NOTE — ED Triage Notes (Addendum)
Pt to triage via w/c, tearful; reports PTA fell down steps injuring right ankle/top of foot; no swelling or deformity noted; +PP, W&D, brisk cap refill

## 2017-11-09 ENCOUNTER — Emergency Department
Admission: EM | Admit: 2017-11-09 | Discharge: 2017-11-09 | Disposition: A | Discharge status: Home / Self Care | Payer: Medicaid Other | Attending: Emergency Medicine | Admitting: Emergency Medicine

## 2017-11-09 DIAGNOSIS — S92511B Displaced fracture of proximal phalanx of right lesser toe(s), initial encounter for open fracture: Secondary | ICD-10-CM

## 2017-11-09 MED ORDER — IBUPROFEN 600 MG PO TABS
ORAL_TABLET | ORAL | Status: AC
  Administered 2017-11-09: 600 mg via ORAL
  Filled 2017-11-09: qty 1

## 2017-11-09 MED ORDER — CEPHALEXIN 500 MG PO CAPS
500.0000 mg | ORAL_CAPSULE | Freq: Four times a day (QID) | ORAL | 0 refills | Status: AC
Start: 2017-11-09 — End: 2017-11-19

## 2017-11-09 MED ORDER — ONDANSETRON 4 MG PO TBDP
4.0000 mg | ORAL_TABLET | Freq: Once | ORAL | Status: AC
  Administered 2017-11-09: 4 mg via ORAL

## 2017-11-09 MED ORDER — BUPIVACAINE HCL (PF) 0.5 % IJ SOLN
30.0000 mL | Freq: Once | INTRAMUSCULAR | Status: AC
  Administered 2017-11-09: 30 mL
  Filled 2017-11-09: qty 30

## 2017-11-09 MED ORDER — OXYCODONE-ACETAMINOPHEN 5-325 MG PO TABS
ORAL_TABLET | ORAL | Status: AC
  Administered 2017-11-09: 2 via ORAL
  Filled 2017-11-09: qty 2

## 2017-11-09 MED ORDER — CEPHALEXIN 500 MG PO CAPS
500.0000 mg | ORAL_CAPSULE | Freq: Once | ORAL | Status: AC
  Administered 2017-11-09: 500 mg via ORAL
  Filled 2017-11-09: qty 1

## 2017-11-09 MED ORDER — IBUPROFEN 600 MG PO TABS: 600.0000 mg | ORAL_TABLET | Freq: Three times a day (TID) | ORAL | 0 refills | Status: AC | PRN

## 2017-11-09 MED ORDER — ONDANSETRON 4 MG PO TBDP
ORAL_TABLET | ORAL | Status: AC
  Administered 2017-11-09: 4 mg via ORAL
  Filled 2017-11-09: qty 1

## 2017-11-09 MED ORDER — TETANUS-DIPHTH-ACELL PERTUSSIS 5-2.5-18.5 LF-MCG/0.5 IM SUSP
0.5000 mL | Freq: Once | INTRAMUSCULAR | Status: AC
  Administered 2017-11-09: 0.5 mL via INTRAMUSCULAR
  Filled 2017-11-09: qty 0.5

## 2017-11-09 MED ORDER — OXYCODONE-ACETAMINOPHEN 5-325 MG PO TABS
1.0000 | ORAL_TABLET | Freq: Once | ORAL | Status: AC
  Administered 2017-11-09: 1 via ORAL
  Filled 2017-11-09: qty 1

## 2017-11-09 MED ORDER — SODIUM CHLORIDE 0.9 % IV BOLUS: 1000.0000 mL | Freq: Once | INTRAVENOUS | Status: DC

## 2017-11-09 MED ORDER — IBUPROFEN 600 MG PO TABS
600.0000 mg | ORAL_TABLET | Freq: Once | ORAL | Status: AC
  Administered 2017-11-09: 600 mg via ORAL

## 2017-11-09 MED ORDER — OXYCODONE-ACETAMINOPHEN 5-325 MG PO TABS: 1.0000 | ORAL_TABLET | ORAL | 0 refills | Status: AC | PRN

## 2017-11-09 MED ORDER — CEFAZOLIN SODIUM-DEXTROSE 1-4 GM/50ML-% IV SOLN: 1.0000 g | Freq: Once | INTRAVENOUS | Status: DC

## 2017-11-09 MED ORDER — OXYCODONE-ACETAMINOPHEN 5-325 MG PO TABS
2.0000 | ORAL_TABLET | Freq: Once | ORAL | Status: AC
  Administered 2017-11-09: 2 via ORAL

## 2017-11-09 NOTE — ED Notes (Addendum)
Reviewed xray results, charge nurse notified; pt taken to room 26 via w/c and assisted onto bed; Dr Lamont Snowballifenbark at bedside; small open area noted upon inspection to underside crease of right 5th toe

## 2017-11-09 NOTE — Discharge Instructions (Signed)
Please wear a hard soled shoe at all times until you can follow-up with the podiatrist within the next few days for reevaluation.  Take all of your antibiotics as prescribed and return to the emergency department sooner for any concerns whatsoever.  It was a pleasure to take care of you today, and thank you for coming to our emergency department.  If you have any questions or concerns before leaving please ask the nurse to grab me and I'm more than happy to go through your aftercare instructions again.  If you were prescribed any opioid pain medication today such as Norco, Vicodin, Percocet, morphine, hydrocodone, or oxycodone please make sure you do not drive when you are taking this medication as it can alter your ability to drive safely.  If you have any concerns once you are home that you are not improving or are in fact getting worse before you can make it to your follow-up appointment, please do not hesitate to call [phone] and come back for further evaluation.  Merrily Brittle, MD  Results for orders placed or performed during the hospital encounter of 08/08/17  Lipase, blood  Result Value Ref Range   Lipase 26 11 - 51 U/L  Comprehensive metabolic panel  Result Value Ref Range   Sodium 140 135 - 145 mmol/L   Potassium 4.3 3.5 - 5.1 mmol/L   Chloride 108 101 - 111 mmol/L   CO2 23 22 - 32 mmol/L   Glucose, Bld 104 (H) 65 - 99 mg/dL   BUN 13 6 - 20 mg/dL   Creatinine, Ser 1.61 0.44 - 1.00 mg/dL   Calcium 8.8 (L) 8.9 - 10.3 mg/dL   Total Protein 7.8 6.5 - 8.1 g/dL   Albumin 4.4 3.5 - 5.0 g/dL   AST 18 15 - 41 U/L   ALT 17 14 - 54 U/L   Alkaline Phosphatase 53 38 - 126 U/L   Total Bilirubin 0.7 0.3 - 1.2 mg/dL   GFR calc non Af Amer >60 >60 mL/min   GFR calc Af Amer >60 >60 mL/min   Anion gap 9 5 - 15  CBC  Result Value Ref Range   WBC 9.5 3.6 - 11.0 K/uL   RBC 4.76 3.80 - 5.20 MIL/uL   Hemoglobin 12.3 12.0 - 16.0 g/dL   HCT 09.6 04.5 - 40.9 %   MCV 78.5 (L) 80.0 - 100.0 fL   MCH  25.9 (L) 26.0 - 34.0 pg   MCHC 33.0 32.0 - 36.0 g/dL   RDW 81.1 91.4 - 78.2 %   Platelets 249 150 - 440 K/uL  Urinalysis, Complete w Microscopic  Result Value Ref Range   Color, Urine YELLOW (A) YELLOW   APPearance HAZY (A) CLEAR   Specific Gravity, Urine 1.021 1.005 - 1.030   pH 6.0 5.0 - 8.0   Glucose, UA NEGATIVE NEGATIVE mg/dL   Hgb urine dipstick NEGATIVE NEGATIVE   Bilirubin Urine NEGATIVE NEGATIVE   Ketones, ur 20 (A) NEGATIVE mg/dL   Protein, ur NEGATIVE NEGATIVE mg/dL   Nitrite NEGATIVE NEGATIVE   Leukocytes, UA NEGATIVE NEGATIVE   RBC / HPF 0-5 0 - 5 RBC/hpf   WBC, UA 0-5 0 - 5 WBC/hpf   Bacteria, UA NONE SEEN NONE SEEN   Squamous Epithelial / LPF 0-5 (A) NONE SEEN   Mucus PRESENT   Pregnancy, urine POC  Result Value Ref Range   Preg Test, Ur NEGATIVE NEGATIVE   Dg Ankle Complete Right  Result Date: 11/09/2017 CLINICAL DATA:  Patient fell down steps injuring right ankle prior to arrival. EXAM: RIGHT ANKLE - COMPLETE 3+ VIEW COMPARISON:  None. FINDINGS: There is no evidence of fracture, dislocation, or joint effusion. There is no evidence of arthropathy or other focal bone abnormality. Slight soft tissue swelling over the malleoli. Base of fifth metatarsal appears intact. The tibiotalar, subtalar and midfoot articulations appear intact. IMPRESSION: No acute osseous abnormality. Electronically Signed   By: Tollie Ethavid  Kwon M.D.   On: 11/09/2017 00:08   Dg Foot Complete Right  Result Date: 11/09/2017 CLINICAL DATA:  Patient fell down steps prior to arrival injuring the right foot. EXAM: RIGHT FOOT COMPLETE - 3+ VIEW COMPARISON:  None. FINDINGS: Subcutaneous soft tissue emphysema is seen in the webspace between the fourth and fifth toes with a comminuted fracture of the right proximal phalanx. No intra-articular extension is seen. No joint dislocation is noted. IMPRESSION: Comminuted fracture of the right fifth proximal phalanx. Adjacent soft tissue emphysema raises concern for an  open fracture. No radiopaque foreign body is noted. Electronically Signed   By: Tollie Ethavid  Kwon M.D.   On: 11/09/2017 00:09

## 2017-11-09 NOTE — ED Provider Notes (Signed)
[facility] Emergency Department Provider Note  ____________________________________________   None    (approximate)  I have reviewed the triage vital signs and the nursing notes.   HISTORY  Chief Complaint Ankle Pain   HPI [name] is a 32 y.o. female who self presents the emergency department with sudden onset severe pain to her right foot after rolling it while walking downstairs.  She was barefoot and inside.  She had sudden onset severe pain that is now worse with ambulation improved with rest.  She is unable to bear weight secondary to pain.  Unknown last tetanus shot.  Past Medical History:  Diagnosis Date  . Depression   . PIH (pregnancy induced hypertension)     There are no active problems to display for this patient.   Past Surgical History:  Procedure Laterality Date  . CESAREAN SECTION     x 3  . TUBAL LIGATION      Prior to Admission medications   Medication Sig Start Date End Date Taking? Authorizing Provider  albuterol (PROVENTIL HFA;VENTOLIN HFA) 108 (90 Base) MCG/ACT inhaler Inhale 2 puffs into the lungs every 4 (four) hours as needed for wheezing or shortness of breath. Patient not taking: Reported on 04/06/2017 07/02/16   Irean Hong, MD  cephALEXin (KEFLEX) 500 MG capsule Take 1 capsule (500 mg total) by mouth 4 (four) times daily for 10 days. 11/09/17 11/19/17  Merrily Brittle, MD  chlorpheniramine-HYDROcodone (TUSSIONEX PENNKINETIC ER) 10-8 MG/5ML SUER Take 5 mLs by mouth 2 (two) times daily. Patient not taking: Reported on 04/06/2017 07/02/16   Irean Hong, MD  chlorpheniramine-HYDROcodone [facility] ER) 10-8 MG/5ML SUER Take 5 mLs by mouth 2 (two) times daily. 06/20/17   Emily Filbert, MD  ibuprofen (ADVIL,MOTRIN) 600 MG tablet Take 1 tablet (600 mg total) by mouth every 8 (eight) hours as needed. 11/09/17   Merrily Brittle, MD  loperamide (IMODIUM A-D) 2 MG tablet Take 1 tablet (2 mg total) by mouth 4  (four) times daily as needed for diarrhea or loose stools. 08/08/17   Minna Antis, MD  ondansetron (ZOFRAN ODT) 4 MG disintegrating tablet Take 1 tablet (4 mg total) by mouth every 8 (eight) hours as needed for nausea or vomiting. Patient not taking: Reported on 04/06/2017 08/26/16   Menshew, Charlesetta Ivory, PA-C  oxyCODONE-acetaminophen (PERCOCET/ROXICET) 5-325 MG tablet Take 1 tablet by mouth every 4 (four) hours as needed for severe pain. 11/09/17   Merrily Brittle, MD  predniSONE (DELTASONE) 20 MG tablet 3 tablets daily x 4 days Patient not taking: Reported on 04/06/2017 07/02/16   Irean Hong, MD  promethazine (PHENERGAN) 25 MG tablet Take 1 tablet (25 mg total) by mouth every 6 (six) hours as needed for nausea or vomiting. 08/08/17   Minna Antis, MD    Allergies Patient has no known allergies.  No family history on file.  Social History Social History   Tobacco Use  . Smoking status: Current Every Day Smoker    Packs/day: 0.50    Types: Cigarettes  . Smokeless tobacco: Never Used  Substance Use Topics  . Alcohol use: Yes  . Drug use: No    Review of Systems Constitutional: No fever/chills ENT: No sore throat. Cardiovascular: Denies chest pain. Respiratory: Denies shortness of breath. Gastrointestinal: No abdominal pain.  Positive for nausea, no vomiting.  Musculoskeletal: Positive for foot pain. Neurological: Negative for headaches   ____________________________________________   PHYSICAL EXAM:  VITAL SIGNS: ED Triage Vitals [11/08/17  2341]  Enc Vitals Group     BP (!) 134/98     Pulse Rate 90     Resp 16     Temp 99.1 F (37.3 C)     Temp Source Oral     SpO2 98 %     Weight 190 lb (86.2 kg)     Height 5\' 6"  (1.676 m)     Head Circumference      Peak Flow      Pain Score 9     Pain Loc      Pain Edu?      Excl. in GC?     Constitutional: Alert and oriented x4 tearful and anxious appearing nontoxic no diaphoresis Head: Atraumatic. Nose:  No congestion/rhinnorhea. Mouth/Throat: No trismus Neck: No stridor.   Cardiovascular: Regular rate and rhythm Respiratory: Normal respiratory effort.  No retractions. MSK: No tenderness over medial malleolus or lateral malleolus or for 6 cm proximal No tenderness over navicular, midfoot, or fifth metatarsal 2+ dorsalis pedis pulse Small laceration between the toes on the dorsal aspect of the foot not actively bleeding Compartments soft Patient can fire extensor hallucis longus, extensor digitorum longus, flexor hallucis longus, flexor digitorum longus, tibialis anterior, and gastrocnemius Sensation intact to light touch to sural, saphenous, deep peroneal, superficial peroneal, and tibial nerve  Neurologic:  Normal speech and language. No gross focal neurologic deficits are appreciated.  Skin:  Skin is warm, dry and intact. No rash noted.    ____________________________________________  LABS (all labs ordered are listed, but only abnormal results are displayed)  Labs Reviewed - No data to display   __________________________________________  EKG   ____________________________________________  RADIOLOGY  X-ray reviewed by me shows fracture to the proximal phalanx ____________________________________________   DIFFERENTIAL includes but not limited to  Foot fracture, toe fracture, ankle fracture, open joint   PROCEDURES  Procedure(s) performed: no  Procedures  Critical Care performed: no  Observation: no ____________________________________________   INITIAL IMPRESSION / ASSESSMENT AND PLAN / ED COURSE  Pertinent labs & imaging results that were available during my care of the patient were reviewed by me and considered in my medical decision making (see chart for details).       ----------------------------------------- 12:44 AM on 11/09/2017 -----------------------------------------  The patient is in severe discomfort with a comminuted fracture to  her distal phalanx on the right foot.  There does appear to be a laceration at the bottom of the foot raising concern for open fracture.  I have a call out to orthopedic surgery to discuss inpatient versus outpatient management. ____________________________________________  I discussed with Dr. Ernest Pine who reviewed the films himself and feels that this is likely a superficial wound and does not connect with the joint and I agree.  He recommends Keflex and outpatient management with podiatry versus orthopedics.  I offered the patient digital block and I initially attempted however she was unable to tolerate.  At this point we will treat her with oral opioids and discharge her with close follow-up.  She understands to wear hard soled shoe.  Strict return precautions have been given the patient verbalizes understanding agreement the plan.   FINAL CLINICAL IMPRESSION(S) / ED DIAGNOSES  Final diagnoses:  Open displaced fracture of proximal phalanx of lesser toe of right foot, initial encounter      NEW MEDICATIONS STARTED DURING THIS VISIT:  Discharge Medication List as of 11/09/2017  2:51 AM    START taking these medications   Details  cephALEXin (KEFLEX)  500 MG capsule Take 1 capsule (500 mg total) by mouth 4 (four) times daily for 10 days., Starting Wed 11/09/2017, Until Sat 11/19/2017, Print         Note:  This document was prepared using Dragon voice recognition software and may include unintentional dictation errors.      Merrily Brittleifenbark, Sherissa Tenenbaum, MD 11/11/17 [phone]

## 2019-04-10 ENCOUNTER — Other Ambulatory Visit: Payer: Self-pay

## 2019-04-10 DIAGNOSIS — Z20822 Contact with and (suspected) exposure to covid-19: Secondary | ICD-10-CM

## 2019-04-12 LAB — NOVEL CORONAVIRUS, NAA: SARS-CoV-2, NAA: NOT DETECTED

## 2019-09-24 ENCOUNTER — Other Ambulatory Visit: Payer: Self-pay

## 2019-09-24 ENCOUNTER — Emergency Department
Admission: EM | Admit: 2019-09-24 | Discharge: 2019-09-24 | Disposition: A | Discharge status: Home / Self Care | Payer: Medicaid Other | Attending: Emergency Medicine | Admitting: Emergency Medicine

## 2019-09-24 DIAGNOSIS — R21 Rash and other nonspecific skin eruption: Secondary | ICD-10-CM | POA: Diagnosis not present

## 2019-09-24 DIAGNOSIS — J029 Acute pharyngitis, unspecified: Secondary | ICD-10-CM | POA: Diagnosis not present

## 2019-09-24 DIAGNOSIS — F17210 Nicotine dependence, cigarettes, uncomplicated: Secondary | ICD-10-CM | POA: Diagnosis not present

## 2019-09-24 DIAGNOSIS — R509 Fever, unspecified: Secondary | ICD-10-CM | POA: Insufficient documentation

## 2019-09-24 LAB — GROUP A STREP BY PCR: Group A Strep by PCR: NOT DETECTED

## 2019-09-24 MED ORDER — DIPHENHYDRAMINE HCL 25 MG PO CAPS
ORAL_CAPSULE | ORAL | Status: AC
  Filled 2019-09-24: qty 1

## 2019-09-24 MED ORDER — DIPHENHYDRAMINE HCL 25 MG PO CAPS
25.0000 mg | ORAL_CAPSULE | Freq: Once | ORAL | Status: AC
  Administered 2019-09-24: 25 mg via ORAL

## 2019-09-24 MED ORDER — PREDNISONE 20 MG PO TABS: 40.0000 mg | ORAL_TABLET | Freq: Every day | ORAL | 0 refills | Status: AC

## 2019-09-24 NOTE — ED Notes (Signed)
Unable to locate patient to given an up date. 

## 2019-09-24 NOTE — Discharge Instructions (Signed)
Get covid test by your primary doctor- Take steroids and benadryl to help with rash Can take Over the counter allegra.  Return for fevers, worsening rash any other concerns

## 2019-09-24 NOTE — ED Notes (Signed)
Unable to locate patient to give an update. 

## 2019-09-24 NOTE — ED Notes (Signed)
MD in room to assess pt. Pt c/o rash and fever. Red bumps noted on pt chest and trunk.

## 2019-09-24 NOTE — ED Provider Notes (Signed)
[facility] Emergency Department Provider Note  ____________________________________________   None    (approximate)  I have reviewed the triage vital signs and the nursing notes.   HISTORY  Chief Complaint Allergic Reaction    HPI [name] is a 34 y.o. female with tubal ligation who comes in with rash.  Pt with pruritic rash that started three days ago.  It has spread to over her body, fine macules.  Nothing makes it better nnothing makes it worse.  Pt was on course prednisone and flexeril that she was on a week ago for for a back surgery but this started after.  Did have fever yesterday per work on forehead thermometer so sent home from work.  Some sore throat as well.  Unsure if was having reaction to flexeril. Did go on hike 2 weeks ago.            Past Medical History:  Diagnosis Date  . Depression   . PIH (pregnancy induced hypertension)     There are no problems to display for this patient.   Past Surgical History:  Procedure Laterality Date  . CESAREAN SECTION     x 3  . TUBAL LIGATION      Prior to Admission medications   Medication Sig Start Date End Date Taking? Authorizing Provider  albuterol (PROVENTIL HFA;VENTOLIN HFA) 108 (90 Base) MCG/ACT inhaler Inhale 2 puffs into the lungs every 4 (four) hours as needed for wheezing or shortness of breath. Patient not taking: Reported on 04/06/2017 07/02/16   Irean Hong, MD  chlorpheniramine-HYDROcodone [facility] ER) 10-8 MG/5ML SUER Take 5 mLs by mouth 2 (two) times daily. Patient not taking: Reported on 04/06/2017 07/02/16   Irean Hong, MD  chlorpheniramine-HYDROcodone [facility] ER) 10-8 MG/5ML SUER Take 5 mLs by mouth 2 (two) times daily. 06/20/17   Emily Filbert, MD  ibuprofen (ADVIL,MOTRIN) 600 MG tablet Take 1 tablet (600 mg total) by mouth every 8 (eight) hours as needed. 11/09/17   Merrily Brittle, MD  loperamide (IMODIUM A-D) 2 MG tablet Take  1 tablet (2 mg total) by mouth 4 (four) times daily as needed for diarrhea or loose stools. 08/08/17   Minna Antis, MD  ondansetron (ZOFRAN ODT) 4 MG disintegrating tablet Take 1 tablet (4 mg total) by mouth every 8 (eight) hours as needed for nausea or vomiting. Patient not taking: Reported on 04/06/2017 08/26/16   Menshew, Charlesetta Ivory, PA-C  oxyCODONE-acetaminophen (PERCOCET/ROXICET) 5-325 MG tablet Take 1 tablet by mouth every 4 (four) hours as needed for severe pain. 11/09/17   Merrily Brittle, MD  predniSONE (DELTASONE) 20 MG tablet 3 tablets daily x 4 days Patient not taking: Reported on 04/06/2017 07/02/16   Irean Hong, MD  promethazine (PHENERGAN) 25 MG tablet Take 1 tablet (25 mg total) by mouth every 6 (six) hours as needed for nausea or vomiting. 08/08/17   Minna Antis, MD    Allergies Patient has no known allergies.  No family history on file.  Social History Social History   Tobacco Use  . Smoking status: Current Every Day Smoker    Packs/day: 0.50    Types: Cigarettes  . Smokeless tobacco: Never Used  Substance Use Topics  . Alcohol use: Yes  . Drug use: No      Review of Systems Constitutional: No fever/chills Eyes: No visual changes. ENT: + sore throat  Cardiovascular: Denies chest pain. Respiratory: Denies shortness of breath. Gastrointestinal: No abdominal pain.  No nausea, no vomiting.  No diarrhea.  No constipation. Genitourinary: Negative for dysuria. Musculoskeletal: Negative for back pain. Skin:  + rash  Neurological: Negative for headaches, focal weakness or numbness. All other ROS negative ____________________________________________   PHYSICAL EXAM:  VITAL SIGNS: ED Triage Vitals  Enc Vitals Group     BP 09/24/19 0200 (!) 136/97     Pulse Rate 09/24/19 0200 (!) 103     Resp 09/24/19 0200 18     Temp 09/24/19 0200 98.3 F (36.8 C)     Temp Source 09/24/19 0200 Oral     SpO2 09/24/19 0200 100 %     Weight 09/24/19 0201 180  lb (81.6 kg)     Height 09/24/19 0201 5\' 6"  (1.676 m)     Head Circumference --      Peak Flow --      Pain Score --      Pain Loc --      Pain Edu? --      Excl. in [place]? --     Constitutional: Alert and oriented. Well appearing and in no acute distress. Eyes: Conjunctivae are normal. EOMI. Head: Atraumatic. Nose: No congestion/rhinnorhea. Mouth/Throat: Mucous membranes are moist.   Neck: No stridor. Trachea Midline. FROM Cardiovascular: tachy, regular rhythm. Grossly normal heart sounds.  Good peripheral circulation. Respiratory: Normal respiratory effort.  No retractions. Lungs CTAB. Gastrointestinal: Soft and nontender. No distention. No abdominal bruits.  Musculoskeletal: No lower extremity tenderness nor edema.  No joint effusions. Neurologic:  Normal speech and language. No gross focal neurologic deficits are appreciated.  Skin:  Fine small raised rash noted over chest, back, forehead with no overlying erythema.  Psychiatric: Mood and affect are normal. Speech and behavior are normal. GU: Deferred   ____________________________________________   LABS (all labs ordered are listed, but only abnormal results are displayed)  Labs Reviewed  GROUP A STREP BY PCR   ____________________________________________   INITIAL IMPRESSION / ASSESSMENT AND PLAN / ED COURSE  [name] was evaluated in Emergency Department on 09/24/2019 for the symptoms described in the history of present illness. She was evaluated in the context of the global COVID-19 pandemic, which necessitated consideration that the patient might be at risk for infection with the SARS-CoV-2 virus that causes COVID-19. Institutional protocols and algorithms that pertain to the evaluation of patients at risk for COVID-19 are in a state of rapid change based on information released by regulatory bodies including the CDC and federal and state organizations. These policies and algorithms were followed during the patient's  care in the ED.     Pt well appearing with rash. Possible allergic reaction vs contact exposure (denies new soaps) vs exposure to something on hike.  No evidence of SJS, EM.  Not petechia to suggest low plts.  Give sore throat strep test ordered.  Lungs clear and no hypoxia. Pt reports mild cough but declines xray of chest.  D/w pt course of prednisone for rash, strep test neg.  Pt declined covid testing at this time, says she will get tested at PCP.  Pt feeling better after the benadryl.   Pt understands benadyrl/allergra/prednisone for the rash and we discussed return precautions.       ____________________________________________   FINAL CLINICAL IMPRESSION(S) / ED DIAGNOSES   Final diagnoses:  Rash      MEDICATIONS GIVEN DURING THIS VISIT:  Medications  diphenhydrAMINE (BENADRYL) 25 mg capsule (has no administration in time range)  diphenhydrAMINE (BENADRYL) capsule 25 mg (25 mg  Oral Given 09/24/19 0207)     ED Discharge Orders    None       Note:  This document was prepared using Dragon voice recognition software and may include unintentional dictation errors.   Concha Se, MD 09/24/19 [phone]

## 2019-09-24 NOTE — ED Notes (Signed)
Pt verbalizes understanding of medications and follow up.

## 2019-09-24 NOTE — ED Notes (Signed)
Patient observed in waiting room on telephone, no acute distress noted.

## 2019-09-24 NOTE — ED Triage Notes (Addendum)
Patient reports thinks having an allergic reaction.  Reports sent home from work with fever of 102.  Reports rash, itching and feeling of throat swelling.  Reports took tylenol and ibuprofen earlier.  Reports took benadryl at 8 pm and used benadryl cream.  Patient is speaking in complete sentences without difficulty or distress, throat is slightly red with no exudate noted.

## 2020-05-21 ENCOUNTER — Other Ambulatory Visit: Payer: Medicaid Other

## 2020-05-23 ENCOUNTER — Other Ambulatory Visit: Payer: Medicaid Other

## 2020-05-25 ENCOUNTER — Other Ambulatory Visit: Payer: Self-pay

## 2020-05-25 ENCOUNTER — Other Ambulatory Visit: Payer: Medicaid Other

## 2020-05-25 DIAGNOSIS — Z20822 Contact with and (suspected) exposure to covid-19: Secondary | ICD-10-CM

## 2020-05-27 LAB — SARS-COV-2, NAA 2 DAY TAT

## 2020-05-27 LAB — NOVEL CORONAVIRUS, NAA: SARS-CoV-2, NAA: DETECTED — AB

## 2021-07-08 ENCOUNTER — Encounter: Payer: Self-pay | Admitting: Emergency Medicine

## 2021-07-08 ENCOUNTER — Emergency Department
Admission: EM | Admit: 2021-07-08 | Discharge: 2021-07-08 | Disposition: A | Discharge status: Home / Self Care | Payer: Medicaid Other | Attending: Emergency Medicine | Admitting: Emergency Medicine

## 2021-07-08 ENCOUNTER — Other Ambulatory Visit: Payer: Self-pay

## 2021-07-08 ENCOUNTER — Emergency Department: Payer: Medicaid Other

## 2021-07-08 DIAGNOSIS — Z20822 Contact with and (suspected) exposure to covid-19: Secondary | ICD-10-CM | POA: Diagnosis not present

## 2021-07-08 DIAGNOSIS — R059 Cough, unspecified: Secondary | ICD-10-CM | POA: Diagnosis not present

## 2021-07-08 DIAGNOSIS — D72829 Elevated white blood cell count, unspecified: Secondary | ICD-10-CM | POA: Diagnosis not present

## 2021-07-08 DIAGNOSIS — F17200 Nicotine dependence, unspecified, uncomplicated: Secondary | ICD-10-CM | POA: Diagnosis not present

## 2021-07-08 DIAGNOSIS — R0602 Shortness of breath: Secondary | ICD-10-CM | POA: Diagnosis not present

## 2021-07-08 LAB — BASIC METABOLIC PANEL
Anion gap: 7 (ref 5–15)
BUN: 13 mg/dL (ref 6–20)
CO2: 28 mmol/L (ref 22–32)
Calcium: 8.5 mg/dL — ABNORMAL LOW (ref 8.9–10.3)
Chloride: 104 mmol/L (ref 98–111)
Creatinine, Ser: 0.8 mg/dL (ref 0.44–1.00)
GFR, Estimated: >60 mL/min (ref >60)
Glucose, Bld: 88 mg/dL (ref 70–99)
Potassium: 3.6 mmol/L (ref 3.5–5.1)
Sodium: 139 mmol/L (ref 135–145)

## 2021-07-08 LAB — CBC
HCT: 37.6 % (ref 36.0–46.0)
Hemoglobin: 12.8 g/dL (ref 12.0–15.0)
MCH: 26.4 pg (ref 26.0–34.0)
MCHC: 34 g/dL (ref 30.0–36.0)
MCV: 77.7 fL — ABNORMAL LOW (ref 80.0–100.0)
Platelets: 273 10*3/uL (ref 150–400)
RBC: 4.84 MIL/uL (ref 3.87–5.11)
RDW: 13.9 % (ref 11.5–15.5)
WBC: 12.1 10*3/uL — ABNORMAL HIGH (ref 4.0–10.5)
nRBC: 0 % (ref 0.0–0.2)

## 2021-07-08 LAB — TROPONIN I (HIGH SENSITIVITY)
Troponin I (High Sensitivity): 4 ng/L (ref <18)
Troponin I (High Sensitivity): 3 ng/L (ref <18)

## 2021-07-08 LAB — RESP PANEL BY RT-PCR (FLU A&B, COVID) ARPGX2
Influenza A by PCR: NEGATIVE
Influenza B by PCR: NEGATIVE
SARS Coronavirus 2 by RT PCR: NEGATIVE

## 2021-07-08 MED ORDER — ALBUTEROL SULFATE HFA 108 (90 BASE) MCG/ACT IN AERS: 2.0000 | INHALATION_SPRAY | Freq: Four times a day (QID) | RESPIRATORY_TRACT | 2 refills | Status: AC | PRN

## 2021-07-08 MED ORDER — IOHEXOL 350 MG/ML SOLN
75.0000 mL | Freq: Once | INTRAVENOUS | Status: AC | PRN
  Administered 2021-07-08: 75 mL via INTRAVENOUS

## 2021-07-08 MED ORDER — ALBUTEROL SULFATE (2.5 MG/3ML) 0.083% IN NEBU
5.0000 mg | INHALATION_SOLUTION | Freq: Once | RESPIRATORY_TRACT | Status: AC
  Administered 2021-07-08: 5 mg via RESPIRATORY_TRACT
  Filled 2021-07-08: qty 6
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
## 2021-07-08 NOTE — ED Triage Notes (Signed)
Pt arrived via POV with reports of shortness of breath, pt states she woke up from her sleep with difficulty breathing and unable to catch her breath, pt able to speak in complete sentences, pt reports hx of blood clots to lungs, not currently on blood thinners.

## 2021-07-08 NOTE — Discharge Instructions (Signed)
As we discussed, follow-up with your primary care doctor for possible sleep study since these episodes of shortness of breath seem to happen at night.  Also try to avoid eating within 3 hours of bedtime and prop yourself up with a couple of pillows before going to sleep as these episodes could be caused by reflux as well.  Return to the emergency room for new or worsening chest pain or shortness of breath

## 2021-12-18 ENCOUNTER — Ambulatory Visit (LOCAL_COMMUNITY_HEALTH_CENTER): Payer: Medicaid Other

## 2021-12-18 DIAGNOSIS — Z111 Encounter for screening for respiratory tuberculosis: Secondary | ICD-10-CM

## 2021-12-18 DIAGNOSIS — Z23 Encounter for immunization: Secondary | ICD-10-CM

## 2021-12-18 DIAGNOSIS — Z719 Counseling, unspecified: Secondary | ICD-10-CM

## 2021-12-18 NOTE — Progress Notes (Signed)
  Are you feeling sick today? No   Have you ever received a dose of COVID-19 Vaccine? [employer], [place], [place], [place], Other) Yes  If yes, which vaccine and how many doses?    Moderna 1 dose  Did you bring the vaccination record card or other documentation?  Yes   Do you have a health condition or are undergoing treatment that makes you moderately or severely immunocompromised? This would include, but not be limited to: cancer, HIV, organ transplant, immunosuppressive therapy/high-dose corticosteroids, or moderate/severe primary immunodeficiency.  No  Have you received COVID-19 vaccine before or during hematopoietic cell transplant (HCT) or CAR-T-cell therapies? No  Have you ever had an allergic reaction to: (This would include a severe allergic reaction or a reaction that caused hives, swelling, or respiratory distress, including wheezing.) A component of a COVID-19 vaccine or a previous dose of COVID-19 vaccine? No   Have you ever had an allergic reaction to another vaccine (other thanCOVID-19 vaccine) or an injectable medication? (This would include a severe allergic reaction or a reaction that caused hives, swelling, or respiratory distress, including wheezing.)   No    Do you have a history of any of the following:  Myocarditis or Pericarditis No  Dermal fillers:  No  Multisystem Inflammatory Syndrome (MIS-C or MIS-A)? No  COVID-19 disease within the past 3 months? No  Vaccinated with monkeypox vaccine in the last 4 weeks? No    In nurse clinic today  for Covid vaccine(Moderna Biv), HPV and PPD skin test. Per pt her PCP recommended she needed Hep A & B vaccines, HPV , and varicella. Reconciled her Immunization records from [place] and [employer] . Pt declined Hep A ,has had varicella and hep B. 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 Pt stated she also needs 2nd step PPD. Advised her when she comes to get PPD read on Monday needs to schedule 2nd step 1  -3wks from negative result. Has reminder card for PPDR. Copies of [facility] and covid vaccine card given to pt.

## 2021-12-21 ENCOUNTER — Ambulatory Visit (LOCAL_COMMUNITY_HEALTH_CENTER): Payer: Self-pay

## 2021-12-21 DIAGNOSIS — Z111 Encounter for screening for respiratory tuberculosis: Secondary | ICD-10-CM

## 2021-12-21 LAB — TB SKIN TEST
Induration: 0 mm
TB Skin Test: NEGATIVE

## 2021-12-22 ENCOUNTER — Other Ambulatory Visit: Payer: Medicaid Other

## 2021-12-28 ENCOUNTER — Ambulatory Visit (LOCAL_COMMUNITY_HEALTH_CENTER): Payer: Medicaid Other

## 2021-12-28 DIAGNOSIS — Z111 Encounter for screening for respiratory tuberculosis: Secondary | ICD-10-CM

## 2021-12-31 ENCOUNTER — Ambulatory Visit (LOCAL_COMMUNITY_HEALTH_CENTER): Payer: Self-pay

## 2021-12-31 DIAGNOSIS — Z111 Encounter for screening for respiratory tuberculosis: Secondary | ICD-10-CM

## 2021-12-31 LAB — TB SKIN TEST
Induration: 0 mm
TB Skin Test: NEGATIVE

## 2022-02-10 ENCOUNTER — Ambulatory Visit: Payer: Medicaid Other | Admitting: Nurse Practitioner

## 2022-02-10 DIAGNOSIS — F32A Depression, unspecified: Secondary | ICD-10-CM | POA: Insufficient documentation

## 2022-02-10 DIAGNOSIS — Z113 Encounter for screening for infections with a predominantly sexual mode of transmission: Secondary | ICD-10-CM

## 2022-02-10 LAB — WET PREP FOR TRICH, YEAST, CLUE
Trichomonas Exam: NEGATIVE
Yeast Exam: NEGATIVE

## 2022-02-10 LAB — HM HEPATITIS C SCREENING LAB: HM Hepatitis Screen: NEGATIVE

## 2022-02-10 LAB — HM HIV SCREENING LAB: HM HIV Screening: NEGATIVE

## 2022-02-10 NOTE — Progress Notes (Signed)
Pt is here for STD screening.  Wet mount results reviewed, no treatment required per Provider.  Windle Guard, RN
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
## 2022-11-05 ENCOUNTER — Ambulatory Visit: Admission: RE | Admit: 2022-11-05 | Payer: Medicaid Other | Source: Home / Self Care
